# Patient Record
Sex: Male | Born: 1945 | Race: White | Hispanic: No | Marital: Single | Smoking: Former smoker
Health system: Southern US, Community
[De-identification: ages and names within clinical notes are randomized; demographics above are authoritative.]

## PROBLEM LIST (undated history)

## (undated) DIAGNOSIS — I8390 Asymptomatic varicose veins of unspecified lower extremity: Secondary | ICD-10-CM

## (undated) DIAGNOSIS — N390 Urinary tract infection, site not specified: Secondary | ICD-10-CM

## (undated) DIAGNOSIS — I714 Abdominal aortic aneurysm, without rupture: Secondary | ICD-10-CM

## (undated) DIAGNOSIS — I2510 Atherosclerotic heart disease of native coronary artery without angina pectoris: Secondary | ICD-10-CM

## (undated) DIAGNOSIS — M1990 Unspecified osteoarthritis, unspecified site: Secondary | ICD-10-CM

## (undated) DIAGNOSIS — K219 Gastro-esophageal reflux disease without esophagitis: Secondary | ICD-10-CM

## (undated) DIAGNOSIS — C61 Malignant neoplasm of prostate: Secondary | ICD-10-CM

## (undated) DIAGNOSIS — Z8719 Personal history of other diseases of the digestive system: Secondary | ICD-10-CM

## (undated) DIAGNOSIS — E119 Type 2 diabetes mellitus without complications: Secondary | ICD-10-CM

## (undated) DIAGNOSIS — I10 Essential (primary) hypertension: Secondary | ICD-10-CM

## (undated) DIAGNOSIS — I209 Angina pectoris, unspecified: Secondary | ICD-10-CM

## (undated) DIAGNOSIS — G2581 Restless legs syndrome: Secondary | ICD-10-CM

## (undated) HISTORY — PX: WRIST ARTHROPLASTY: SHX1088

## (undated) HISTORY — PX: INGUINAL HERNIA REPAIR: SUR1180

## (undated) HISTORY — DX: Type 2 diabetes mellitus without complications: E11.9

## (undated) HISTORY — PX: REPLACEMENT TOTAL KNEE BILATERAL: SUR1225

---

## 1982-09-27 HISTORY — PX: FEMUR FRACTURE SURGERY: SHX633

## 2001-08-03 ENCOUNTER — Encounter: Admission: RE | Admit: 2001-08-03 | Discharge: 2001-11-01 | Payer: Self-pay

## 2004-06-23 ENCOUNTER — Encounter: Admission: RE | Admit: 2004-06-23 | Discharge: 2004-06-23 | Payer: Self-pay | Admitting: Internal Medicine

## 2004-09-27 HISTORY — PX: OTHER SURGICAL HISTORY: SHX169

## 2010-09-27 HISTORY — PX: REPLACEMENT TOTAL KNEE BILATERAL: SUR1225

## 2012-09-28 ENCOUNTER — Emergency Department (HOSPITAL_COMMUNITY): Payer: BC Managed Care – PPO

## 2012-09-28 ENCOUNTER — Inpatient Hospital Stay (HOSPITAL_COMMUNITY)
Admission: EM | Admit: 2012-09-28 | Discharge: 2012-09-30 | DRG: 853 | Disposition: A | Discharge status: Home / Self Care | Payer: BC Managed Care – PPO | Attending: Interventional Cardiology | Admitting: Interventional Cardiology

## 2012-09-28 ENCOUNTER — Encounter (HOSPITAL_COMMUNITY): Payer: Self-pay | Admitting: Vascular Surgery

## 2012-09-28 DIAGNOSIS — Z87891 Personal history of nicotine dependence: Secondary | ICD-10-CM

## 2012-09-28 DIAGNOSIS — N390 Urinary tract infection, site not specified: Secondary | ICD-10-CM | POA: Insufficient documentation

## 2012-09-28 DIAGNOSIS — I214 Non-ST elevation (NSTEMI) myocardial infarction: Principal | ICD-10-CM | POA: Diagnosis present

## 2012-09-28 DIAGNOSIS — I5031 Acute diastolic (congestive) heart failure: Secondary | ICD-10-CM

## 2012-09-28 DIAGNOSIS — I714 Abdominal aortic aneurysm, without rupture, unspecified: Secondary | ICD-10-CM | POA: Diagnosis present

## 2012-09-28 DIAGNOSIS — I8390 Asymptomatic varicose veins of unspecified lower extremity: Secondary | ICD-10-CM | POA: Diagnosis present

## 2012-09-28 DIAGNOSIS — K219 Gastro-esophageal reflux disease without esophagitis: Secondary | ICD-10-CM | POA: Diagnosis present

## 2012-09-28 DIAGNOSIS — Z96659 Presence of unspecified artificial knee joint: Secondary | ICD-10-CM

## 2012-09-28 DIAGNOSIS — R079 Chest pain, unspecified: Secondary | ICD-10-CM

## 2012-09-28 DIAGNOSIS — B952 Enterococcus as the cause of diseases classified elsewhere: Secondary | ICD-10-CM | POA: Insufficient documentation

## 2012-09-28 DIAGNOSIS — Z6841 Body Mass Index (BMI) 40.0 and over, adult: Secondary | ICD-10-CM

## 2012-09-28 DIAGNOSIS — E669 Obesity, unspecified: Secondary | ICD-10-CM | POA: Diagnosis present

## 2012-09-28 DIAGNOSIS — I249 Acute ischemic heart disease, unspecified: Secondary | ICD-10-CM | POA: Diagnosis present

## 2012-09-28 DIAGNOSIS — I872 Venous insufficiency (chronic) (peripheral): Secondary | ICD-10-CM | POA: Insufficient documentation

## 2012-09-28 DIAGNOSIS — N402 Nodular prostate without lower urinary tract symptoms: Secondary | ICD-10-CM | POA: Insufficient documentation

## 2012-09-28 DIAGNOSIS — G4730 Sleep apnea, unspecified: Secondary | ICD-10-CM | POA: Diagnosis present

## 2012-09-28 DIAGNOSIS — I2582 Chronic total occlusion of coronary artery: Secondary | ICD-10-CM | POA: Diagnosis present

## 2012-09-28 DIAGNOSIS — I10 Essential (primary) hypertension: Secondary | ICD-10-CM | POA: Diagnosis present

## 2012-09-28 DIAGNOSIS — Z86718 Personal history of other venous thrombosis and embolism: Secondary | ICD-10-CM | POA: Insufficient documentation

## 2012-09-28 DIAGNOSIS — G2581 Restless legs syndrome: Secondary | ICD-10-CM | POA: Insufficient documentation

## 2012-09-28 DIAGNOSIS — E785 Hyperlipidemia, unspecified: Secondary | ICD-10-CM | POA: Diagnosis present

## 2012-09-28 DIAGNOSIS — I2510 Atherosclerotic heart disease of native coronary artery without angina pectoris: Secondary | ICD-10-CM | POA: Diagnosis present

## 2012-09-28 DIAGNOSIS — M1990 Unspecified osteoarthritis, unspecified site: Secondary | ICD-10-CM | POA: Insufficient documentation

## 2012-09-28 HISTORY — DX: Angina pectoris, unspecified: I20.9

## 2012-09-28 HISTORY — DX: Abdominal aortic aneurysm, without rupture: I71.4

## 2012-09-28 HISTORY — DX: Essential (primary) hypertension: I10

## 2012-09-28 HISTORY — DX: Unspecified osteoarthritis, unspecified site: M19.90

## 2012-09-28 HISTORY — DX: Gastro-esophageal reflux disease without esophagitis: K21.9

## 2012-09-28 HISTORY — DX: Personal history of other diseases of the digestive system: Z87.19

## 2012-09-28 HISTORY — DX: Restless legs syndrome: G25.81

## 2012-09-28 HISTORY — DX: Asymptomatic varicose veins of unspecified lower extremity: I83.90

## 2012-09-28 LAB — PRO B NATRIURETIC PEPTIDE: Pro B Natriuretic peptide (BNP): 1051 pg/mL — ABNORMAL HIGH (ref 0–125)

## 2012-09-28 LAB — COMPREHENSIVE METABOLIC PANEL
ALT: 31 U/L (ref 0–53)
AST: 29 U/L (ref 0–37)
Albumin: 4 g/dL (ref 3.5–5.2)
Alkaline Phosphatase: 52 U/L (ref 39–117)
BUN: 15 mg/dL (ref 6–23)
CO2: 28 mEq/L (ref 19–32)
Calcium: 10.1 mg/dL (ref 8.4–10.5)
Chloride: 97 mEq/L (ref 96–112)
Creatinine, Ser: 0.94 mg/dL (ref 0.50–1.35)
GFR calc Af Amer: >90 mL/min (ref >90)
GFR calc non Af Amer: 85 mL/min — ABNORMAL LOW (ref >90)
Glucose, Bld: 104 mg/dL — ABNORMAL HIGH (ref 70–99)
Potassium: 3.9 mEq/L (ref 3.5–5.1)
Sodium: 137 mEq/L (ref 135–145)
Total Bilirubin: 0.5 mg/dL (ref 0.3–1.2)
Total Protein: 7 g/dL (ref 6.0–8.3)

## 2012-09-28 LAB — CBC
HCT: 40.3 % (ref 39.0–52.0)
Hemoglobin: 13.5 g/dL (ref 13.0–17.0)
MCH: 30 pg (ref 26.0–34.0)
MCHC: 33.5 g/dL (ref 30.0–36.0)
MCV: 89.6 fL (ref 78.0–100.0)
Platelets: 153 10*3/uL (ref 150–400)
RBC: 4.5 MIL/uL (ref 4.22–5.81)
RDW: 13.1 % (ref 11.5–15.5)
WBC: 7.6 10*3/uL (ref 4.0–10.5)

## 2012-09-28 LAB — MAGNESIUM: Magnesium: 1.8 mg/dL (ref 1.5–2.5)

## 2012-09-28 LAB — PROTIME-INR
INR: 1.04 (ref 0.00–1.49)
Prothrombin Time: 13.5 seconds (ref 11.6–15.2)

## 2012-09-28 LAB — POCT I-STAT TROPONIN I: Troponin i, poc: 0.22 ng/mL (ref 0.00–0.08)

## 2012-09-28 LAB — TROPONIN I: Troponin I: 0.32 ng/mL (ref <0.30)

## 2012-09-28 MED ORDER — ROPINIROLE HCL 0.25 MG PO TABS
0.2500 mg | ORAL_TABLET | Freq: Every day | ORAL | Status: DC
  Administered 2012-09-28 – 2012-09-29 (×2): 0.25 mg via ORAL
  Filled 2012-09-28 (×4): qty 1

## 2012-09-28 MED ORDER — ASPIRIN 81 MG PO CHEW
324.0000 mg | CHEWABLE_TABLET | ORAL | Status: AC
  Administered 2012-09-29: 07:00:00 324 mg via ORAL
  Filled 2012-09-28: qty 4

## 2012-09-28 MED ORDER — ENOXAPARIN SODIUM 150 MG/ML ~~LOC~~ SOLN
130.0000 mg | Freq: Two times a day (BID) | SUBCUTANEOUS | Status: DC
  Filled 2012-09-28 (×2): qty 1

## 2012-09-28 MED ORDER — ASPIRIN EC 81 MG PO TBEC: 81.0000 mg | DELAYED_RELEASE_TABLET | Freq: Every day | ORAL | Status: DC

## 2012-09-28 MED ORDER — METOPROLOL SUCCINATE ER 25 MG PO TB24
25.0000 mg | ORAL_TABLET | Freq: Every day | ORAL | Status: DC
  Administered 2012-09-29 – 2012-09-30 (×2): 25 mg via ORAL
  Filled 2012-09-28 (×2): qty 1

## 2012-09-28 MED ORDER — ADULT MULTIVITAMIN W/MINERALS CH
1.0000 | ORAL_TABLET | Freq: Every day | ORAL | Status: DC
  Administered 2012-09-29 – 2012-09-30 (×2): 1 via ORAL
  Filled 2012-09-28 (×2): qty 1

## 2012-09-28 MED ORDER — FERROUS GLUCONATE 324 (38 FE) MG PO TABS
325.0000 mg | ORAL_TABLET | Freq: Every day | ORAL | Status: DC
  Administered 2012-09-29 – 2012-09-30 (×2): 324 mg via ORAL
  Filled 2012-09-28 (×3): qty 1

## 2012-09-28 MED ORDER — NITROGLYCERIN 0.4 MG SL SUBL: 0.4000 mg | SUBLINGUAL_TABLET | SUBLINGUAL | Status: DC | PRN

## 2012-09-28 MED ORDER — SODIUM CHLORIDE 0.9 % IV SOLN
INTRAVENOUS | Status: DC
  Administered 2012-09-28: 23:00:00 via INTRAVENOUS

## 2012-09-28 MED ORDER — SODIUM CHLORIDE 0.9 % IJ SOLN: 3.0000 mL | Freq: Two times a day (BID) | INTRAMUSCULAR | Status: DC

## 2012-09-28 MED ORDER — SODIUM CHLORIDE 0.9 % IJ SOLN: 3.0000 mL | INTRAMUSCULAR | Status: DC | PRN

## 2012-09-28 MED ORDER — TRIAMTERENE-HCTZ 37.5-25 MG PO TABS
1.0000 | ORAL_TABLET | Freq: Every day | ORAL | Status: DC
  Administered 2012-09-29 – 2012-09-30 (×2): 1 via ORAL
  Filled 2012-09-28 (×2): qty 1

## 2012-09-28 MED ORDER — ATORVASTATIN CALCIUM 80 MG PO TABS
80.0000 mg | ORAL_TABLET | Freq: Every day | ORAL | Status: DC
  Filled 2012-09-28: qty 1

## 2012-09-28 MED ORDER — PANTOPRAZOLE SODIUM 40 MG PO TBEC
40.0000 mg | DELAYED_RELEASE_TABLET | Freq: Every day | ORAL | Status: DC
  Administered 2012-09-29: 18:00:00 40 mg via ORAL
  Filled 2012-09-28: qty 1

## 2012-09-28 MED ORDER — ACETAMINOPHEN 325 MG PO TABS: 650.0000 mg | ORAL_TABLET | ORAL | Status: DC | PRN

## 2012-09-28 MED ORDER — SODIUM CHLORIDE 0.9 % IV SOLN: 250.0000 mL | INTRAVENOUS | Status: DC | PRN

## 2012-09-28 MED ORDER — ENOXAPARIN SODIUM 150 MG/ML ~~LOC~~ SOLN
130.0000 mg | Freq: Once | SUBCUTANEOUS | Status: AC
  Administered 2012-09-28: 130 mg via SUBCUTANEOUS
  Filled 2012-09-28 (×2): qty 1

## 2012-09-28 MED ORDER — ASPIRIN 81 MG PO CHEW
324.0000 mg | CHEWABLE_TABLET | Freq: Once | ORAL | Status: AC
  Administered 2012-09-28: 324 mg via ORAL
  Filled 2012-09-28: qty 4

## 2012-09-28 MED ORDER — ONDANSETRON HCL 4 MG/2ML IJ SOLN: 4.0000 mg | Freq: Four times a day (QID) | INTRAMUSCULAR | Status: DC | PRN

## 2012-09-28 MED ORDER — DIAZEPAM 5 MG PO TABS
5.0000 mg | ORAL_TABLET | ORAL | Status: AC
  Administered 2012-09-29: 13:00:00 5 mg via ORAL
  Filled 2012-09-28: qty 1

## 2012-09-28 NOTE — ED Notes (Addendum)
Pt arrives to the ED via [facility] from [facility]. Pt reports sharp CP that radiates to the left arm with exertion. Pt reports he has been pain free today and remains so at this time. 12 lead unremarkable per EMS. Pt denies any SOB or N/V. No nitro or 324 ASA given. Pt has hx of AAA.

## 2012-09-28 NOTE — H&P (Signed)
Fernando Blankenship is a 67 y.o. male  Admit date: 09/28/2012 Referring Physician: Kirby Funk, M.D. Primary Cardiologist:: Gwynneth Albright, M.D. Chief complaint / reason for admission: Exertional chest discomfort  HPI: 67 year old gentleman with recent onset of exertional chest tightness and dyspnea over the past 72 hours. He states that walking 30 yards particularly ankle weather causes his chest to become tight and radiates into his arms. There is associated dyspnea. Rest relieves the discomfort. He denies episodes of rest. He denies orthopnea, dyspnea at rest, prior such symptoms, melena, and history of heart disease. He has had progressive dyspnea on exertion over the past several years    PMH:    Past Medical History  Diagnosis Date  . Varicose vein of leg   . Restless leg syndrome   . AAA (abdominal aortic aneurysm)   . Hypertension   . GERD (gastroesophageal reflux disease)     PSH:    Past Surgical History  Procedure Date  . Replacement total knee bilateral   . Wrist surgery   . Joint replacement     Bilateral total knee replacements  . Hernia repair     Left inguinal herniorrhaphy    ALLERGIES:   Review of patient's allergies indicates no known allergies.  Prior to Admit Meds:   (Not in a hospital admission) Family HX:    Family History  Problem Relation Age of Onset  . Other Mother   . Diabetes Mellitus II Sister   . Alcoholism Father   . Hypertension Brother   . Diabetes Mellitus II Brother   . Dementia Sister   . Throat cancer Sister   . Lung cancer Sister    Social HX:    History   Social History  . Marital Status: Single    Spouse Name: N/A    Number of Children: N/A  . Years of Education: N/A   Occupational History  . Not on file.   Social History Main Topics  . Smoking status: Former Games developer  . Smokeless tobacco: Not on file  . Alcohol Use: No  . Drug Use: No  . Sexually Active:    Other Topics Concern  . Not on file   Social  History Narrative  . No narrative on file     ROS: He has been told he snores loudly. His nose or extremity swelling. There is been progressive dyspnea on exertion over the past several years. No episodes of PND. He denies palpitations, syncope, cough, wheezing, neurological complaints, and bleeding ulcers. He has had difficulty with prostate symptoms. He is followed by Dr. Margreta Journey  Physical Exam: Blood pressure 134/75, pulse 88, temperature 98.1 F (36.7 C), temperature source Oral, resp. rate 20, SpO2 97.00%.   The patient is moderately obese. He is in no distress. Skin color is normal.  There is no nail bed cyanosis.  HEENT exam reveals pupils are equal and reactive. No Johns is noted.  Lungs are clear auscultation and percussion.  Cardiac exam reveals somewhat distant heart sounds. No murmur, rub, click, or gallop is heard.   Abdomen is obese. Palpation reveals no obvious masses. No bruits are heard. There is no tenderness.  There is 2+ bilateral lower extremity edema. Posterior tibial pulses are 1-2+ bilaterally. Femoral pulses are 1-2+ bilaterally. Without bruits. The radials are 2+ and symmetric bilaterally. Carotids reveal no bruits and upstrokes are 2+  The patient is alert and oriented x3. No motor sensory deficits and noted.  Labs:  Lab Results  Component Value Date   WBC 7.6 09/28/2012     Lab 09/28/12 1824  NA 137  K 3.9  CL 97  CO2 28  BUN 15  CREATININE 0.94  CALCIUM 10.1  PROT 7.0  BILITOT 0.5  ALKPHOS 52  ALT 31  AST 29  GLUCOSE 104*   Troponin (Point of Care Test)  [facility] 09/28/12 1853  TROPIPOC 0.22*     Radiology:    EKG:  EKG reveals normal sinus rhythm poor R wave progression J-point depression in leads 2, 3, and aVF. Suggestion of biphasic T wave in V2 and V3.  ASSESSMENT:   1. Acute coronary syndrome with elevated troponin and angina with minimal exertion  2. Hypertension  3. Obesity  4. Possible sleep apnea  5  gastroesophageal reflux  Plan:  1. Serial markers and EKGs to rule out myocardial infarction  2. Subcutaneous Lovenox  3. Beta blocker therapy  4. Coronary angiography to define anatomy and help guide therapy. The procedure and its risks including stroke, death, myocardial infarction, bleeding, allergy, kidney injury, among others were discussed in detail except above the patient.  5. If pain at rest, IV nitroglycerin will be used.  Lesleigh Noe 09/28/2012 7:30 PM

## 2012-09-28 NOTE — ED Notes (Signed)
Critical I-stat troponin results of 0.22 reported to [facility]

## 2012-09-28 NOTE — ED Provider Notes (Signed)
History     CSN: [id number]  Arrival date & time 09/28/12  1805   First MD Initiated Contact with Patient 09/28/12 1823      Chief Complaint  Patient presents with  . Chest Pain    (Consider location/radiation/quality/duration/timing/severity/associated sxs/prior treatment) Patient is a 67 y.o. male presenting with chest pain. The history is provided by the patient. No language interpreter was used.  Chest Pain The chest pain began 3 - 5 days ago. Chest pain occurs intermittently. The pain is associated with exertion. The quality of the pain is described as pressure-like. The pain does not radiate. Chest pain is worsened by exertion. Pertinent negatives for primary symptoms include no fever, no cough, no abdominal pain, no nausea and no vomiting.  Associated symptoms include lower extremity edema.  Pertinent negatives for associated symptoms include no diaphoresis, no near-syncope, no numbness, no orthopnea and no weakness. He tried nothing for the symptoms.  Pertinent negatives for past medical history include no CAD, no cancer, no COPD, no CHF, no DVT, no MI, no PE and no strokes.    67 year old male coming from Dr. Tresa Moore office today with [facility] with EKG changes. Patient has been having intermittent chest pain lasting minutes especially on exertion with shortness of breath. Resting makes the pain go away. He's had no nausea vomiting diaphoresis.   The pain is of midsternal chest pain 7/10. Dr. Katrinka Blazing has been here to see the patient. He will be admitted to his service. No family history. No prior chest pain before Monday. 1+ pitting edema to lower extremities bilaterally.   Past Medical History  Diagnosis Date  . Varicose vein of leg   . Restless leg syndrome   . AAA (abdominal aortic aneurysm)   . Hypertension   . GERD (gastroesophageal reflux disease)     Past Surgical History  Procedure Date  . Replacement total knee bilateral   . Wrist surgery   . Joint  replacement     Bilateral total knee replacements  . Hernia repair     Left inguinal herniorrhaphy    Family History  Problem Relation Age of Onset  . Other Mother   . Diabetes Mellitus II Sister   . Alcoholism Father   . Hypertension Brother   . Diabetes Mellitus II Brother   . Dementia Sister   . Throat cancer Sister   . Lung cancer Sister     History  Substance Use Topics  . Smoking status: Former Games developer  . Smokeless tobacco: Not on file  . Alcohol Use: No      Review of Systems  Constitutional: Negative.  Negative for fever and diaphoresis.  HENT: Negative.   Eyes: Negative.   Respiratory: Negative.  Negative for cough.   Cardiovascular: Positive for chest pain and leg swelling. Negative for orthopnea and near-syncope.  Gastrointestinal: Negative.  Negative for nausea, vomiting, abdominal pain and diarrhea.  Musculoskeletal: Negative for back pain.  Neurological: Negative.  Negative for weakness and numbness.  Psychiatric/Behavioral: Negative.   All other systems reviewed and are negative.    Allergies  Review of patient's allergies indicates no known allergies.  Home Medications   Current Outpatient Rx  Name  Route  Sig  Dispense  Refill  . FERROUS GLUCONATE 325 MG PO TABS   Oral   Take 325 mg by mouth daily with breakfast.         . IBUPROFEN 200 MG PO TABS   Oral   Take 800 mg  by mouth every 8 (eight) hours as needed. For pain         . ADULT MULTIVITAMIN W/MINERALS CH   Oral   Take 1 tablet by mouth daily.         Marland Kitchen PRILOSEC OTC PO   Oral   Take 1 tablet by mouth daily.         Marland Kitchen ROPINIROLE HCL 0.25 MG PO TABS   Oral   Take 0.25 mg by mouth at bedtime.          . TRIAMTERENE-HCTZ PO   Oral   Take 1 tablet by mouth every morning.           BP 145/95  Pulse 84  Temp 98.1 F (36.7 C) (Oral)  Resp 18  SpO2 98%  Physical Exam  Nursing note and vitals reviewed. Constitutional: He is oriented to person, place, and time.  He appears well-developed and well-nourished.  HENT:  Head: Normocephalic.  Eyes: Conjunctivae normal and EOM are normal. Pupils are equal, round, and reactive to light.  Neck: Normal range of motion. Neck supple.  Cardiovascular: Normal rate and normal heart sounds.   Pulmonary/Chest: Effort normal and breath sounds normal. No respiratory distress. He has no wheezes.  Abdominal: Soft. Bowel sounds are normal. He exhibits no distension. There is no tenderness.  Musculoskeletal: Normal range of motion. He exhibits edema.  Neurological: He is alert and oriented to person, place, and time.  Skin: Skin is warm and dry.  Psychiatric: He has a normal mood and affect.    ED Course  Procedures (including critical care time)  Labs Reviewed  POCT I-STAT TROPONIN I - Abnormal; Notable for the following:    Troponin i, poc 0.22 (*)     All other components within normal limits  CBC  COMPREHENSIVE METABOLIC PANEL   No results found.   No diagnosis found.    MDM  [id number] obese male with midsternal chest pain that has been intermittent x3 days especially with exertion. Patient coming from Dr. Tresa Moore office to notify Dr. Katrinka Blazing that there were EKG changes on his EKG. Patient does have a positive point of care troponin of . 22. Patient is pain-free now. Chest x-ray is unremarkable and reviewed by myself. All other labs are unremarkable. This is a sheered visit with Dr. Preston Fleeting. Patient will be admitted by [facility] and have a cardiac cath in the a.m.  Labs Reviewed  COMPREHENSIVE METABOLIC PANEL - Abnormal; Notable for the following:    Glucose, Bld 104 (*)     GFR calc non Af Amer 85 (*)     All other components within normal limits  POCT I-STAT TROPONIN I - Abnormal; Notable for the following:    Troponin i, poc 0.22 (*)     All other components within normal limits  CBC          Remi Haggard, NP 09/28/12 2012  Remi Haggard, NP 10/03/12 1245

## 2012-09-28 NOTE — ED Provider Notes (Signed)
67 year old male comes in with a three-day history of exertional chest pain. If he walks about 100 feet, he gets a tight feeling in his chest which is moderately severe and he rates it at 7/10. There is associated dyspnea, but no nausea, vomiting, or diaphoresis. The amount of exertion required to bring on chest tightness has not changed. He had symptoms like this before. On exam, lungs are clear and heart has regular rate and rhythm. He has 2-3+ pitting edema. Troponin has come back elevated at a low level of 0.22. Dr. Katrinka Blazing of cardiology has been in to evaluate the patient to admit him.   Date: 09/28/2012  Rate: 83  Rhythm: normal sinus rhythm  QRS Axis: normal  Intervals: normal  ST/T Wave abnormalities: nonspecific T wave changes  Conduction Disutrbances:none  Narrative Interpretation: Minor nonspecific T wave flattening. No prior ECG available for comparison.  Old EKG Reviewed: none available  CRITICAL CARE Performed by: RUEAV,[email or web address]   Total critical care time: 35 minutes  Critical care time was exclusive of separately billable procedures and treating other patients.  Critical care was necessary to treat or prevent imminent or life-threatening deterioration.  Critical care was time spent personally by me on the following activities: development of treatment plan with patient and/or surrogate as well as nursing, discussions with consultants, evaluation of patient's response to treatment, examination of patient, obtaining history from patient or surrogate, ordering and performing treatments and interventions, ordering and review of laboratory studies, ordering and review of radiographic studies, pulse oximetry and re-evaluation of patient's condition.  Medical screening examination/treatment/procedure(s) were conducted as a shared visit with non-physician practitioner(s) and myself.  I personally evaluated the patient during the encounter   Dione Booze, MD 09/28/12 [phone]

## 2012-09-28 NOTE — Progress Notes (Signed)
ANTICOAGULATION CONSULT NOTE - Initial Consult  Pharmacy Consult for lovenox Indication: chest pain/ACS  No Known Allergies  Patient Measurements: Wt= 288 lbs per patient (131 kg)  Vital Signs: Temp: 98.1 F (36.7 C) (01/02 1814) Temp src: Oral (01/02 1814) BP: 108/55 mmHg (01/02 2030) Pulse Rate: 82  (01/02 2030)  Labs:  [facility] 09/28/12 1824  HGB 13.5  HCT 40.3  PLT 153  APTT --  LABPROT --  INR --  HEPARINUNFRC --  CREATININE 0.94  CKTOTAL --  CKMB --  TROPONINI --    CrCl is unknown because there is no height on file for the current visit.   Medical History: Past Medical History  Diagnosis Date  . Varicose vein of leg   . Restless leg syndrome   . AAA (abdominal aortic aneurysm)   . Hypertension   . GERD (gastroesophageal reflux disease)     Assessment: 67 yo male here with CP to start lovenox.  Goal of Therapy:  Monitor platelets by anticoagulation protocol: Yes   Plan:  -Begin lovenox 130 mg sq q12h -CBC every 3 days -Will follow clinical progress  Harland German, Pharm D 09/28/2012 8:49 PM

## 2012-09-29 ENCOUNTER — Other Ambulatory Visit: Payer: Self-pay

## 2012-09-29 ENCOUNTER — Encounter (HOSPITAL_COMMUNITY)
Admission: EM | Disposition: A | Discharge status: Home / Self Care | Payer: Self-pay | Source: Home / Self Care | Attending: Interventional Cardiology

## 2012-09-29 ENCOUNTER — Ambulatory Visit (HOSPITAL_COMMUNITY): Admit: 2012-09-29 | Payer: Self-pay | Admitting: Interventional Cardiology

## 2012-09-29 ENCOUNTER — Encounter (HOSPITAL_COMMUNITY): Payer: Self-pay | Admitting: General Practice

## 2012-09-29 DIAGNOSIS — I5031 Acute diastolic (congestive) heart failure: Secondary | ICD-10-CM | POA: Insufficient documentation

## 2012-09-29 HISTORY — PX: PERCUTANEOUS CORONARY STENT INTERVENTION (PCI-S): SHX5485

## 2012-09-29 HISTORY — PX: LEFT HEART CATHETERIZATION WITH CORONARY ANGIOGRAM: SHX5451

## 2012-09-29 LAB — TROPONIN I
Troponin I: 0.33 ng/mL (ref <0.30)
Troponin I: <0.3 ng/mL (ref <0.30)

## 2012-09-29 LAB — HEMOGLOBIN A1C
Hgb A1c MFr Bld: 6.4 % — ABNORMAL HIGH (ref <5.7)
Mean Plasma Glucose: 137 mg/dL — ABNORMAL HIGH (ref <117)

## 2012-09-29 LAB — MRSA PCR SCREENING: MRSA by PCR: NEGATIVE

## 2012-09-29 LAB — TSH: TSH: 4.533 u[IU]/mL — ABNORMAL HIGH (ref 0.350–4.500)

## 2012-09-29 LAB — POCT ACTIVATED CLOTTING TIME: Activated Clotting Time: 677 seconds

## 2012-09-29 SURGERY — LEFT HEART CATHETERIZATION WITH CORONARY ANGIOGRAM
Anesthesia: LOCAL

## 2012-09-29 MED ORDER — BIVALIRUDIN 250 MG IV SOLR
INTRAVENOUS | Status: AC
  Filled 2012-09-29: qty 250

## 2012-09-29 MED ORDER — OXYCODONE-ACETAMINOPHEN 5-325 MG PO TABS: 1.0000 | ORAL_TABLET | ORAL | Status: DC | PRN

## 2012-09-29 MED ORDER — SODIUM CHLORIDE 0.9 % IV SOLN
1.7500 mg/kg/h | INTRAVENOUS | Status: AC
  Administered 2012-09-29: 1.75 mg/kg/h via INTRAVENOUS
  Filled 2012-09-29: qty 250

## 2012-09-29 MED ORDER — FENTANYL CITRATE 0.05 MG/ML IJ SOLN
INTRAMUSCULAR | Status: AC
  Filled 2012-09-29: qty 2

## 2012-09-29 MED ORDER — ATORVASTATIN CALCIUM 20 MG PO TABS
20.0000 mg | ORAL_TABLET | Freq: Every day | ORAL | Status: DC
  Administered 2012-09-29: 20 mg via ORAL
  Filled 2012-09-29 (×2): qty 1

## 2012-09-29 MED ORDER — MIDAZOLAM HCL 2 MG/2ML IJ SOLN
INTRAMUSCULAR | Status: AC
  Filled 2012-09-29: qty 2

## 2012-09-29 MED ORDER — HEPARIN (PORCINE) IN NACL 2-0.9 UNIT/ML-% IJ SOLN
INTRAMUSCULAR | Status: AC
  Filled 2012-09-29: qty 1000

## 2012-09-29 MED ORDER — LIDOCAINE HCL (PF) 1 % IJ SOLN
INTRAMUSCULAR | Status: AC
  Filled 2012-09-29: qty 30

## 2012-09-29 MED ORDER — ACETAMINOPHEN 325 MG PO TABS: 650.0000 mg | ORAL_TABLET | ORAL | Status: DC | PRN

## 2012-09-29 MED ORDER — TICAGRELOR 90 MG PO TABS
90.0000 mg | ORAL_TABLET | Freq: Two times a day (BID) | ORAL | Status: DC
  Administered 2012-09-29 – 2012-09-30 (×2): 90 mg via ORAL
  Filled 2012-09-29 (×3): qty 1

## 2012-09-29 MED ORDER — TICAGRELOR 90 MG PO TABS
ORAL_TABLET | ORAL | Status: AC
  Administered 2012-09-29: 90 mg via ORAL
  Filled 2012-09-29: qty 2

## 2012-09-29 MED ORDER — VERAPAMIL HCL 2.5 MG/ML IV SOLN
INTRAVENOUS | Status: AC
  Filled 2012-09-29: qty 2

## 2012-09-29 MED ORDER — HEPARIN SODIUM (PORCINE) 1000 UNIT/ML IJ SOLN
INTRAMUSCULAR | Status: AC
  Filled 2012-09-29: qty 1

## 2012-09-29 MED ORDER — ASPIRIN 81 MG PO CHEW
81.0000 mg | CHEWABLE_TABLET | Freq: Every day | ORAL | Status: DC
  Administered 2012-09-30: 09:00:00 81 mg via ORAL
  Filled 2012-09-29: qty 1

## 2012-09-29 MED ORDER — ONDANSETRON HCL 4 MG/2ML IJ SOLN: 4.0000 mg | Freq: Four times a day (QID) | INTRAMUSCULAR | Status: DC | PRN

## 2012-09-29 MED ORDER — SODIUM CHLORIDE 0.9 % IV SOLN
1.0000 mL/kg/h | INTRAVENOUS | Status: AC
  Administered 2012-09-29 (×2): 1 mL/kg/h via INTRAVENOUS

## 2012-09-29 MED ORDER — NITROGLYCERIN 0.2 MG/ML ON CALL CATH LAB
INTRAVENOUS | Status: AC
  Filled 2012-09-29: qty 1

## 2012-09-29 NOTE — CV Procedure (Signed)
Diagnostic Cardiac Catheterization and Coronary intervention Report  Fernando Blankenship  67 y.o.  male [birth year]  Procedure Date: 09/29/2012 Referring Physician: Kirby Funk, M.D. Primary Cardiologist:: Gwynneth Albright, M.D.   PROCEDURE:  Left heart catheterization with selective coronary angiography, left ventriculogram, drug-eluting stent implantation proximal LAD.   INDICATIONS:  Acute coronary syndrome denoted by pain with minimal activity and trace positive troponin levels x3  The risks, benefits, and details of the procedure were explained to the patient.  The patient verbalized understanding and wanted to proceed.  Informed written consent was obtained.  PROCEDURE TECHNIQUE:  After Xylocaine anesthesia a 5 French sheath was placed in the right radial artery with a single anterior needle wall stick.   Coronary angiography was done using a 5 [place] A2 MP catheter.  Left ventriculography was done using a 5 [place] A2 MP catheter.    After review of the digital images we identified the proximal LAD is a culprit for the patient's presentation. We also noted that he had anteroapical moderate hypokinesis and thinning collateralization of the distal LAD from the right coronary injections. We decided to proceed with percutaneous coronary intervention. The patient was loaded with Brilinta and a bolus followed by an infusion of bivalirudin was started. ACT was documented to be greater than 300 seconds.  Over a guidewire we upgraded the sheath to a 6 [place]. We initially used an XB LAD 6 French 3.5 cm guide catheter but could not properly engaged without damping. We then decreased iron XB LAD 6 French catheter to a 3 cm and we were able to obtain having chest without damping.  We use a prolonged guidewire across the stenosis in the LAD. We predilated with a 3.0 x 15 mm sprinter ledge and balloon. We then positioned and deployed a Promus Premier 3.0 x 24 mm long drug-eluting stent. Post  dilated with a 3.25 x 15 mm [place] sprinter in the proximal one third of the stent was further post dilated with a 3.5 x 9 mm [place] sprinter. Peak post dilatation pressure was 14 atmospheres. TIMI grade 3 flow was noted. Moderate distal disease beyond the stent was noted to not treated.  The equipment was removed and wristband applied with good hemostasis.  CONTRAST:  Total of 310 cc.  COMPLICATIONS:  None.    HEMODYNAMICS:  Aortic pressure was 110/54 mmHg; LV pressure was 122/8 mmHg; LVEDP 22 mm mercury.  There was no gradient between the left ventricle and aorta.    ANGIOGRAPHIC DATA:   The left main coronary artery is widely patent..  The left anterior descending artery is the left anterior descending contains a segmental complex thrombus containing lesion in the proximal segment before the margin of the diagonal and septal perforator. There is diffuse disease throughout this segment. Following the acute obstruction there is a segmental 50% stenosis distal to the first septal perforator and proximal to the second diagonal. The LAD wraps around the left ventricular apex..  The left circumflex artery is is moderate in size and trifurcates on the lateral wall..  The right coronary artery is is dominant. Left ventricular branch and PDA are large.Marland Kitchen  LEFT VENTRICULOGRAM:  Left ventricular angiogram was done in the 30 RAO projection and revealed decreased left ventricular systolic function with moderate mid to distal anterior wall hypokinesis. EF is 45% .  PERCUTANEOUS CORONARY INTERVENTION:   The LAD was stented with a 24 mm long by 3.0 mm Promus Premier drug-eluting stent and post  dilated to 3.5 mm in diameter at 14 atmospheres. No complications occurred. The procedure was made more challenging because of inability to achieve coaxial guide alignment from the radial approach using the guide catheter that we chose. No complications occurred because of this. The 99% stenosis with TIMI grade 3 flow was  reduced to 0% with TIMI grade 3 flow.  IMPRESSIONS:  1. Acute coronary syndrome do to 99% proximal LAD stenosis with TIMI grade 2 flow.  2. Successful drug-eluting stent implantation into the proximal LAD reducing 99% stenosis to 0% with TIMI grade 3 flow.  3. Residual 40-50% mid LAD stenosis  4. Widely patent circumflex and right coronary  5. 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 I believe anterior wall was stunned.   RECOMMENDATION:  1. Aspirin and Brilinta  2. Discontinue Angiomax  3. Discharge in a.m. if no complications.

## 2012-09-29 NOTE — Progress Notes (Signed)
Utilization Review Completed.   Javelle Donigan, RN, BSN Nurse Case Manager  [phone]  

## 2012-09-29 NOTE — Progress Notes (Signed)
TR BAND REMOVAL  LOCATION:  right radial  DEFLATED PER PROTOCOL:  yes  TIME BAND OFF / DRESSING APPLIED:   1850   SITE UPON ARRIVAL:   Level 0  SITE AFTER BAND REMOVAL:  Level 0  REVERSE ALLEN'S TEST:    positive  CIRCULATION SENSATION AND MOVEMENT:  Within Normal Limits  yes  COMMENTS:

## 2012-09-29 NOTE — Progress Notes (Signed)
Pt's 2nd troponin level comes back at 0.32.  Pt is not having any chest pain.  VSS.  Dr. Adolm Joseph made aware of troponin and updated on pt's hosp coarse thus far and probable cath in am.  No orders received at this time.

## 2012-09-29 NOTE — H&P (Signed)
He was admitted last evening with an acute coronary syndrome that included angina with minimal exertion, low positive troponins, and mildly abnormal EKG with biphasic T waves V1 through V3. She's been pain free since admission on subcutaneous Lovenox. The nature of the procedure and its indications were discussed again this morning with the patient in detail. We also discussed the risks of the procedure to include death, stroke, myocardial infarction, emergency surgery, bleeding, allergy, limb ischemia, among others. The patient understands these risks and is willing to proceed. I divides him to make his family aware that he was in the hospital and to give them some idea of the nature of his problem.

## 2012-09-30 DIAGNOSIS — E669 Obesity, unspecified: Secondary | ICD-10-CM | POA: Diagnosis present

## 2012-09-30 DIAGNOSIS — E785 Hyperlipidemia, unspecified: Secondary | ICD-10-CM | POA: Diagnosis present

## 2012-09-30 LAB — BASIC METABOLIC PANEL
BUN: 13 mg/dL (ref 6–23)
CO2: 30 mEq/L (ref 19–32)
Calcium: 9.1 mg/dL (ref 8.4–10.5)
Chloride: 98 mEq/L (ref 96–112)
Creatinine, Ser: 1.05 mg/dL (ref 0.50–1.35)
GFR calc Af Amer: 83 mL/min — ABNORMAL LOW (ref >90)
GFR calc non Af Amer: 72 mL/min — ABNORMAL LOW (ref >90)
Glucose, Bld: 132 mg/dL — ABNORMAL HIGH (ref 70–99)
Potassium: 4.2 mEq/L (ref 3.5–5.1)
Sodium: 135 mEq/L (ref 135–145)

## 2012-09-30 LAB — CBC
HCT: 40 % (ref 39.0–52.0)
Hemoglobin: 13.7 g/dL (ref 13.0–17.0)
MCH: 30.6 pg (ref 26.0–34.0)
MCHC: 34.3 g/dL (ref 30.0–36.0)
MCV: 89.5 fL (ref 78.0–100.0)
Platelets: 139 10*3/uL — ABNORMAL LOW (ref 150–400)
RBC: 4.47 MIL/uL (ref 4.22–5.81)
RDW: 13.1 % (ref 11.5–15.5)
WBC: 5.8 10*3/uL (ref 4.0–10.5)

## 2012-09-30 MED ORDER — ASPIRIN 81 MG PO CHEW: 81.0000 mg | CHEWABLE_TABLET | Freq: Every day | ORAL | Status: AC

## 2012-09-30 MED ORDER — METOPROLOL SUCCINATE ER 25 MG PO TB24: 25.0000 mg | ORAL_TABLET | Freq: Every day | ORAL | Status: DC

## 2012-09-30 MED ORDER — NITROGLYCERIN 0.4 MG SL SUBL: 0.4000 mg | SUBLINGUAL_TABLET | SUBLINGUAL | Status: DC | PRN

## 2012-09-30 MED ORDER — TICAGRELOR 90 MG PO TABS: 90.0000 mg | ORAL_TABLET | Freq: Two times a day (BID) | ORAL | Status: DC

## 2012-09-30 MED ORDER — ATORVASTATIN CALCIUM 20 MG PO TABS: 20.0000 mg | ORAL_TABLET | Freq: Every day | ORAL | Status: DC

## 2012-09-30 NOTE — Progress Notes (Signed)
CARDIAC REHAB PHASE I   PRE:  Rate/Rhythm: Sr 96  BP:  Supine:   Sitting: 127/100  Standing:    SaO2: RA  MODE:  Ambulation: 600 ft   POST:  Rate/Rhythem: 102  BP:  Supine:   Sitting: 137/78  Standing:    SaO2: RA  Pt ambulated in hallway by Fabio Pierce EP.  Pt tolerated well with no complaints of cp or sob.  Education provided at bedside.  Education includes heart healthy nutrition, exercise guidelines with activity tolerance, risk factor modification, stress management, NTG protocol with the alert of 911 for unrelieved chest pain, stent booklet with card and medication compliance. Pt declined participation in outpatient cardiac rehab exercise, pt participating in water aerobics with walking due to bilateral leg discomfort.  Pt given handout for [facility] Cardiac rehab to consider at a later time.  [phone] [phone]  Arna Medici

## 2012-09-30 NOTE — Discharge Summary (Signed)
Physician Discharge Summary  Patient ID: Fernando Blankenship MRN: [id number] DOB/AGE: 05/17/1946 67 y.o.  Admit date: 09/28/2012 Discharge date: 09/30/2012  Primary Physician:  Dr. Kirby Funk  Primary Discharge Diagnosis: 1. Unstable angina pectoris with acute coronary artery syndrome and non STEMI  Secondary Discharge Diagnosis: 2. Coronary artery disease with severe proximal LAD disease treated with drug-eluting stent with residual 40-50% mid LAD stenosis 3. Obesity 4. Hypertension 5. Abdominal aneurysm 6. History of varicose veins 7. Hyperlipidemia 8. Esophageal reflux  Procedures: Cardiac catheterization with insertion of drug-eluting stent  Hospital Course: This 67 year old male was admitted to the hospital recent onset of exertional chest tightness and dyspnea 3 days prior to admission. He noted that his symptoms were precipitated by cold weather and caused progressive chest tightness radiated into his arm. He has had also progressive dyspnea.  He was brought into the hospital and placed on subcutaneous Lovenox. Troponin is were mildly elevated. He had no EKG changes. He was taken to the cardiac catheterization laboratory to have the procedure done through the radial approach on the right. He was found to have a subtotal 99% stenosis with some stunning of the anterior wall. This was treated with a 3.0 x 24 mm Promus premier stent and was post dilated to 3.25 in the proximal one third of the stent and further post dilated with a 3.5 proximally. He was noted to have moderate disease beyond the stent that was not treated. He was ambulatory in the hall the  next day was feeling better and was discharged in improved condition. He was seen by cardiac rehabilitation but declined participation in outpatient cardiac rehabilitation as he stated that he was dissipating in water aerobics.  Discharge Exam: Blood pressure 115/75, pulse 87, temperature 98.1 F (36.7 C), temperature source Oral, resp.  rate 13, height 5\' 7"  (1.702 m), weight 130.6 kg (287 lb 14.7 oz), SpO2 95.00%.   Radial site was clean and dry without hematoma .  Labs: CBC:   Lab Results  Component Value Date   WBC 5.8 09/30/2012   HGB 13.7 09/30/2012   HCT 40.0 09/30/2012   MCV 89.5 09/30/2012   PLT 139* 09/30/2012   CMP:  Lab 09/30/12 0530 09/28/12 1824  NA 135 --  K 4.2 --  CL 98 --  CO2 30 --  BUN 13 --  CREATININE 1.05 --  CALCIUM 9.1 --  PROT -- 7.0  BILITOT -- 0.5  ALKPHOS -- 52  ALT -- 31  AST -- 29  GLUCOSE 132* --   Lipid Panel  No results found for this basename: chol, trig, hdl, cholhdl, vldl, ldlcalc   Cardiac Enzymes:  Basename 09/29/12 1011 09/29/12 0430 09/28/12 2216  CKTOTAL -- -- --  CKMB -- -- --  CKMBINDEX -- -- --  TROPONINI <0.30 0.33* 0.32*    Radiology: Lungs and heart normal, degenerative changes of spine  EKG: Low voltage QRS, no acute changes  Discharge Medications:  Fernando Blankenship, Fernando Blankenship  Home Medication Instructions [id number]   Printed on:09/30/12 0943  Medication Information                    rOPINIRole (REQUIP) 0.25 MG tablet Take 0.25 mg by mouth at bedtime.            ibuprofen (ADVIL,MOTRIN) 200 MG tablet Take 800 mg by mouth every 8 (eight) hours as needed. For pain           TRIAMTERENE-HCTZ PO Take 1 tablet by mouth every  morning.           Omeprazole Magnesium (PRILOSEC OTC PO) Take 1 tablet by mouth daily.           Multiple Vitamin (MULTIVITAMIN WITH MINERALS) TABS Take 1 tablet by mouth daily.           ferrous gluconate (FERGON) 325 MG tablet Take 325 mg by mouth daily with breakfast.           aspirin 81 MG chewable tablet Chew 1 tablet (81 mg total) by mouth daily.           atorvastatin (LIPITOR) 20 MG tablet Take 1 tablet (20 mg total) by mouth daily at 6 PM.           nitroGLYCERIN (NITROSTAT) 0.4 MG SL tablet Place 1 tablet (0.4 mg total) under the tongue every 5 (five) minutes x 3 doses as needed for chest pain.             Ticagrelor (BRILINTA) 90 MG TABS tablet Take 1 tablet (90 mg total) by mouth 2 (two) times daily.           metoprolol succinate (TOPROL-XL) 25 MG 24 hr tablet Take 1 tablet (25 mg total) by mouth daily.             Followup plans and appointments: Dr. Katrinka Blazing in 10 days    Time spent with patient to include physician time: 30 minutes Signed: W. Ashley Royalty. MD [facility] 09/30/2012, 9:43 AM

## 2012-10-02 MED FILL — Dextrose Inj 5%: INTRAVENOUS | Qty: 50 | Status: AC

## 2012-10-04 NOTE — Progress Notes (Signed)
Utilization Review Completed.   Jezreel Justiniano, RN, BSN Nurse Case Manager  [phone]  

## 2013-07-31 ENCOUNTER — Other Ambulatory Visit: Payer: Self-pay

## 2013-07-31 MED ORDER — NITROGLYCERIN 0.4 MG SL SUBL: 0.4000 mg | SUBLINGUAL_TABLET | SUBLINGUAL | Status: DC | PRN

## 2013-08-02 ENCOUNTER — Telehealth: Payer: Self-pay | Admitting: Interventional Cardiology

## 2013-08-02 ENCOUNTER — Other Ambulatory Visit: Payer: Self-pay

## 2013-08-02 MED ORDER — NITROGLYCERIN 0.4 MG SL SUBL: 0.4000 mg | SUBLINGUAL_TABLET | SUBLINGUAL | Status: DC | PRN

## 2013-08-02 NOTE — Telephone Encounter (Signed)
New problem      [facility]   On [address]. Gave call to refills

## 2013-08-06 ENCOUNTER — Encounter: Payer: Self-pay | Admitting: Interventional Cardiology

## 2013-08-09 ENCOUNTER — Other Ambulatory Visit: Payer: Self-pay | Admitting: *Deleted

## 2013-08-09 MED ORDER — NITROGLYCERIN 0.4 MG SL SUBL: 0.4000 mg | SUBLINGUAL_TABLET | SUBLINGUAL | Status: DC | PRN

## 2013-10-18 ENCOUNTER — Ambulatory Visit: Payer: BC Managed Care – PPO | Admitting: Interventional Cardiology

## 2013-10-22 ENCOUNTER — Encounter (HOSPITAL_COMMUNITY): Payer: Self-pay | Admitting: Cardiology

## 2013-10-24 ENCOUNTER — Ambulatory Visit: Payer: BC Managed Care – PPO | Admitting: Interventional Cardiology

## 2013-10-28 ENCOUNTER — Other Ambulatory Visit: Payer: Self-pay | Admitting: *Deleted

## 2013-10-28 DIAGNOSIS — E785 Hyperlipidemia, unspecified: Secondary | ICD-10-CM

## 2013-11-27 ENCOUNTER — Encounter: Payer: Self-pay | Admitting: Interventional Cardiology

## 2013-12-06 ENCOUNTER — Ambulatory Visit: Payer: BC Managed Care – PPO | Admitting: Interventional Cardiology

## 2014-06-14 ENCOUNTER — Encounter: Payer: Self-pay | Admitting: *Deleted

## 2014-08-07 ENCOUNTER — Ambulatory Visit
Admission: RE | Admit: 2014-08-07 | Discharge: 2014-08-07 | Disposition: A | Discharge status: Home / Self Care | Payer: BC Managed Care – PPO | Source: Ambulatory Visit | Attending: Internal Medicine | Admitting: Internal Medicine

## 2014-08-07 ENCOUNTER — Other Ambulatory Visit: Payer: Self-pay | Admitting: Internal Medicine

## 2014-08-07 DIAGNOSIS — J069 Acute upper respiratory infection, unspecified: Secondary | ICD-10-CM

## 2014-09-05 ENCOUNTER — Encounter (HOSPITAL_COMMUNITY): Payer: Self-pay | Admitting: Interventional Cardiology

## 2015-10-09 ENCOUNTER — Encounter: Payer: BLUE CROSS/BLUE SHIELD | Attending: Internal Medicine

## 2015-10-09 VITALS — Ht 67.0 in | Wt 280.6 lb

## 2015-10-09 DIAGNOSIS — E119 Type 2 diabetes mellitus without complications: Secondary | ICD-10-CM | POA: Diagnosis present

## 2015-10-09 DIAGNOSIS — Z713 Dietary counseling and surveillance: Secondary | ICD-10-CM | POA: Diagnosis not present

## 2015-10-09 NOTE — Progress Notes (Signed)

## 2015-10-16 DIAGNOSIS — E119 Type 2 diabetes mellitus without complications: Secondary | ICD-10-CM

## 2015-10-23 DIAGNOSIS — E119 Type 2 diabetes mellitus without complications: Secondary | ICD-10-CM

## 2015-10-23 NOTE — Progress Notes (Signed)

## 2015-10-23 NOTE — Progress Notes (Signed)
Patient was seen on 10/23/15 for the third of a series of three diabetes self-management courses at the [facility].   Catalina Gravel the amount of activity recommended for healthy living . Describe activities suitable for individual needs . Identify ways to regularly incorporate activity into daily life . Identify barriers to activity and ways to over come these barriers  Identify diabetes medications being personally used and their primary action for lowering glucose and possible side effects . Describe role of stress on blood glucose and develop strategies to address psychosocial issues . Identify diabetes complications and ways to prevent them  Explain how to manage diabetes during illness . Evaluate success in meeting personal goal . Establish 2-3 goals that they will plan to diligently work on until they return for the  68-month follow-up visit  Goals:   I will be active 30 minutes or more 3 times a week  I will test my glucose at least 2 times a day, 7 days a week  To help manage stress I will  exercise at least 3 times a week  Your patient has identified these potential barriers to change:  None identified  Your patient has identified their diabetes self-care support plan as  None identified  Plan:  Attend Optional Core 4 in 4 months

## 2017-06-23 DIAGNOSIS — H903 Sensorineural hearing loss, bilateral: Secondary | ICD-10-CM | POA: Insufficient documentation

## 2017-06-23 DIAGNOSIS — H9313 Tinnitus, bilateral: Secondary | ICD-10-CM | POA: Insufficient documentation

## 2017-07-01 ENCOUNTER — Encounter: Payer: Self-pay | Admitting: Cardiology

## 2018-07-06 NOTE — Progress Notes (Signed)
Shiroma, Fernando Blankenship  Date of visit:  07/01/2017 DOB:  [birth year]    Age:  72 yrs. Medical record number:  [id number]     Account number:  [id number] Primary Care Provider: Delanna Ahmadi ____________________________ CURRENT DIAGNOSES  1. CAD Native without angina  2. Essential (primary) hypertension  3. Gastro-esophageal reflux disease without esophagitis  4. Hyperlipidemia  5. Presence of coronary stent  6. Morbid (severe) obesity ____________________________ ALLERGIES  Clopidogrel, Rash ____________________________ MEDICATIONS  1. aspirin 81 mg chewable tablet, 1 p.o. daily  2. atorvastatin 20 mg tablet, 1 p.o. daily  3. B Complex 1 tablet, 1 p.o. daily  4. ibuprofen 200 mg tablet, PRN  5. iron 325 mg (65 mg iron) tablet, 1 p.o. daily  6. metformin 500 mg tablet, 1 p.o. daily  7. metoprolol succinate ER 25 mg tablet,extended release 24 hr, 1 p.o. daily  8. multivitamin tablet, 1 p.o. daily  9. nitroglycerin 0.4 mg sublingual tablet, PRN  10. oxybutynin chloride 5 mg tablet, Take as directed  11. Prilosec OTC 20 mg tablet,delayed release, 1 p.o. daily  12. ropinirole 1 mg tablet, PRN  13. Tart Cherry Extract 1,000 mg capsule, 8 qd  14. triamterene 37.5 mg-hydrochlorothiazide 25 mg capsule, 1 p.o. daily  15. Tylenol Extra Strength 500 mg tablet, PRN ____________________________ CHIEF COMPLAINTS  Followup of CAD Native without angina ____________________________ HISTORY OF PRESENT ILLNESS Patient returns for cardiac followup. He has had a good year since he was here and denies angina. He has no PND, orthopnea, syncope, palpitations, or claudication. He continues to work in [place] at [employer]. He has gained about 10 pounds of weight since he was here. 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 Previous lipids reviewed from primary doctor with good control of  LDL. ____________________________ PAST HISTORY  Past Medical Illnesses:  hypertension, hyperlipidemia, obesity, sleep apnea, GERD, AAA-asymptomatic, venous insufficiency, restless legs, DM-non-insulin dependent;  Cardiovascular Illnesses:  CAD, abdominal aneurysm;  Surgical Procedures:  knee replacement-bil, fx femur, hernia repair, wrist arthroplasty;  NYHA Classification:  I;  Canadian Angina Classification:  Class 0: Asymptomatic;  Cardiology Procedures-Invasive:  cardiac cath (left) January 2014, Promus stent 3.0 x 24 stent in prox LAD dilated to 3.5 and 3.25 mm Dr. Tamala Julian;  Cardiology Procedures-Noninvasive:  treadmill;  Cardiac Cath Results:  normal Left main, no significant disease CFX, no significant disease RCA, 99% stenosis proximal LAD, 50% stenosis mid LAD;  LVEF of 45% documented via cardiac cath on 09/28/2012,   ____________________________ CARDIO-PULMONARY TEST DATES EKG Date:  05/06/2016;   Cardiac Cath Date:  09/28/2012;  Stent Placement Date: 09/29/2012;  Chest Xray Date: 10/25/2013;   ____________________________ FAMILY HISTORY Brother -- Brother alive with problem, Diabetes mellitus Father -- Father dead, Alcoholism Mother -- Mother dead, Diabetes mellitus Sister -- Sister dead, Cancer Sister -- Sister alive and well Sister -- Sister dead, Dementia/Alzheimers, Diabetes mellitus ____________________________ SOCIAL HISTORY Alcohol Use:  no alcohol use;  Smoking:  used to smoke but quit 1990, 25 pack year history;  Diet:  regular diet;  Lifestyle:  divorced;  Exercise:  water exercise and exercise bike;  Occupation:  Publishing rights manager;  Residence:  lives alone;   ____________________________ REVIEW OF SYSTEMS General:  weight gain of approximately 10 lbs Eyes: wears eye glasses/contact lenses Respiratory: denies dyspnea, cough, wheezing or hemoptysis. Cardiovascular:  please review HPI Abdominal: denies dyspepsia, GI bleeding, constipation, or diarrhea  Musculoskeletal:   arthritis of the ankles, arthritis of the hands Endocrine: new diagnosis of diabetes  ____________________________ PHYSICAL EXAMINATION VITAL SIGNS  Blood Pressure:  118/78 Sitting, Left arm, regular cuff   Pulse:  80/min. Weight:  278.00 lbs. Height:  67.00"BMI: 43  Constitutional:  pleasant white male in no acute distress walks with cane, severely obese walks with cane Skin:  warm and dry to touch, no apparent skin lesions, or masses noted. Head:  normocephalic, balding male hair pattern Neck:  supple, without massess. No JVD, thyromegaly or carotid bruits. Carotid upstroke normal. Chest:  normal symmetry, clear to auscultation. Cardiac:  regular rhythm, normal S1 and S2, No S3 or S4, no murmurs, gallops or rubs detected. Peripheral Pulses:  pulses full and equal in all extremities Extremities & Back:  bilateral venous insufficiency changes present, trace edema Neurological:  no gross motor or sensory deficits noted, affect appropriate, oriented x3. ____________________________ MOST RECENT LIPID PANEL 11/19/16  CHOL TOTL 125 mg/dl, LDL 40 NM, HDL 33 mg/dl and TRIGLYCER 264 mg/dl ____________________________ IMPRESSIONS/PLAN  1. CAD with previous LAD stent with no angina 2. Hypertension controlled 3. Morbid obesity with weight gain-need to lose additional weight  4. Hyperlipidemia under treatment  Recommendations:  Refill of medications done. Stressed importance of additional weight reduction and regular exercise. Followup in one year and call if recurrent problems. ____________________________ TODAYS ORDERS  1. 12 Lead EKG: 1 year  2. Return Visit: 1 year                       ____________________________ Cardiology Physician:  Kerry Hough MD [facility]

## 2018-08-16 ENCOUNTER — Telehealth: Payer: Self-pay | Admitting: Cardiology

## 2018-08-16 ENCOUNTER — Other Ambulatory Visit: Payer: Self-pay | Admitting: Cardiology

## 2018-08-16 MED ORDER — METOPROLOL SUCCINATE ER 25 MG PO TB24: 25.0000 mg | ORAL_TABLET | Freq: Every day | ORAL | 0 refills | Status: DC

## 2018-08-16 NOTE — Telephone Encounter (Signed)
30 day supply of metoprolol succinate [facility] [place] with no refills as patient is overdue for follow up. Paula at the front desk has left message for patient to contact our office to schedule appointment.

## 2018-08-16 NOTE — Telephone Encounter (Signed)
LAM for patient to call back to schedule f/up. Due 07/06/18, informed pt prescription was called in.

## 2018-08-16 NOTE — Telephone Encounter (Signed)
° ° °  1. Which medications need to be refilled? (please list name of each medication and dose if known) Metoprolol succ ER 25mg  tablet 1QD  2. Which pharmacy/location (including street and city if local pharmacy) is medication to be sent to? [facility] [address] [facility]  3. Do they need a 30 day or 90 day supply? [place]

## 2018-08-23 MED ORDER — METOPROLOL SUCCINATE ER 25 MG PO TB24: 25.0000 mg | ORAL_TABLET | Freq: Every day | ORAL | 0 refills | Status: DC

## 2018-08-23 NOTE — Telephone Encounter (Signed)
Rx resent to [facility] on [address] as requested.

## 2018-09-01 ENCOUNTER — Ambulatory Visit: Payer: BLUE CROSS/BLUE SHIELD | Admitting: Cardiology

## 2018-09-04 ENCOUNTER — Other Ambulatory Visit: Payer: Self-pay | Admitting: Cardiology

## 2018-09-04 NOTE — Telephone Encounter (Signed)
° ° ° °  1. Which medications need to be refilled? (please list name of each medication and dose if known) atorvastatin 20mg  tablet 1QD  2. Which pharmacy/location (including street and city if local pharmacy) is medication to be sent to?[address] [facility]  3. Do they need a 30 day or 90 day supply? [place]

## 2018-09-05 MED ORDER — ATORVASTATIN CALCIUM 20 MG PO TABS: 20.0000 mg | ORAL_TABLET | Freq: Every day | ORAL | 0 refills | Status: DC

## 2018-09-05 NOTE — Telephone Encounter (Signed)
[facility] [place] with no refills. Patient is scheduled to see Dr. Geraldo Pitter on 10/06/2018. Further refills will be provided during appointment.

## 2018-09-13 ENCOUNTER — Other Ambulatory Visit: Payer: Self-pay | Admitting: *Deleted

## 2018-09-13 NOTE — Telephone Encounter (Signed)
*  STAT* If patient is at the pharmacy, call can be transferred to refill team.   1. Which medications need to be refilled? (please list name of each medication and dose if known) Metoprolol Succ ER 25 mg qd  2. Which pharmacy/location (including street and city if local pharmacy) is [facility] [address]  3. Do they need a 30 day or 90 day supply? [place]

## 2018-09-14 MED ORDER — METOPROLOL SUCCINATE ER 25 MG PO TB24: 25.0000 mg | ORAL_TABLET | Freq: Every day | ORAL | 0 refills | Status: DC

## 2018-09-14 NOTE — Telephone Encounter (Signed)
Has been given to Dr. Agustin Cree for him to review

## 2018-09-14 NOTE — Telephone Encounter (Signed)
Metoprolol succinate refilled 25 mg daily per Dr. Agustin Cree.

## 2018-10-03 ENCOUNTER — Telehealth: Payer: Self-pay

## 2018-10-03 MED ORDER — ATORVASTATIN CALCIUM 20 MG PO TABS: 20.0000 mg | ORAL_TABLET | Freq: Every day | ORAL | 0 refills | Status: DC

## 2018-10-03 NOTE — Telephone Encounter (Signed)
Patient has an appointment on 10/06/2018 with [facility]. Rx for atorvastatin 20mg  #30 0 refills sent to pharmacy.

## 2018-10-04 ENCOUNTER — Other Ambulatory Visit: Payer: Self-pay

## 2018-10-06 ENCOUNTER — Ambulatory Visit (INDEPENDENT_AMBULATORY_CARE_PROVIDER_SITE_OTHER): Payer: BLUE CROSS/BLUE SHIELD | Admitting: Cardiology

## 2018-10-06 ENCOUNTER — Encounter: Payer: Self-pay | Admitting: Cardiology

## 2018-10-06 VITALS — BP 122/68 | HR 80 | Ht 67.0 in | Wt 270.0 lb

## 2018-10-06 DIAGNOSIS — I2510 Atherosclerotic heart disease of native coronary artery without angina pectoris: Secondary | ICD-10-CM

## 2018-10-06 DIAGNOSIS — E669 Obesity, unspecified: Secondary | ICD-10-CM

## 2018-10-06 DIAGNOSIS — E088 Diabetes mellitus due to underlying condition with unspecified complications: Secondary | ICD-10-CM | POA: Diagnosis not present

## 2018-10-06 DIAGNOSIS — E782 Mixed hyperlipidemia: Secondary | ICD-10-CM | POA: Diagnosis not present

## 2018-10-06 NOTE — Patient Instructions (Signed)
Medication Instructions:  Your physician recommends that you continue on your current medications as directed. Please refer to the Current Medication list given to you today.  If you need a refill on your cardiac medications before your next appointment, please call your pharmacy.   Lab work: None.  If you have labs (blood work) drawn today and your tests are completely normal, you will receive your results only by: . MyChart Message (if you have MyChart) OR . A paper copy in the mail If you have any lab test that is abnormal or we need to change your treatment, we will call you to review the results.  Testing/Procedures: None.   Follow-Up: At [facility], you and your health needs are our priority.  As part of our continuing mission to provide you with exceptional heart care, we have created designated Provider Care Teams.  These Care Teams include your primary Cardiologist (physician) and Advanced Practice Providers (APPs -  Physician Assistants and Nurse Practitioners) who all work together to provide you with the care you need, when you need it. You will need a follow up appointment in 6 months.  Please call our office 2 months in advance to schedule this appointment.  You may see No primary care provider on file. or another member of our [facility] Provider Team in [place]: Robert Krasowski, MD . Brian Munley, MD  Any Other Special Instructions Will Be Listed Below (If Applicable).     
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
## 2018-10-13 ENCOUNTER — Telehealth: Payer: Self-pay | Admitting: Cardiology

## 2018-10-13 ENCOUNTER — Other Ambulatory Visit: Payer: Self-pay | Admitting: Cardiology

## 2018-10-13 ENCOUNTER — Telehealth: Payer: Self-pay | Admitting: Emergency Medicine

## 2018-10-13 MED ORDER — ATORVASTATIN CALCIUM 20 MG PO TABS: 20.0000 mg | ORAL_TABLET | Freq: Every day | ORAL | 2 refills | Status: AC

## 2018-10-13 NOTE — Telephone Encounter (Signed)
error 

## 2018-10-13 NOTE — Telephone Encounter (Signed)
Rx for atorvastatin sent to [id number] with 2 refills.

## 2018-10-13 NOTE — Telephone Encounter (Signed)
° °  1. Which medications need to be refilled? (please list name of each medication and dose if known) atorvastatin  2. Which pharmacy/location (including street and city if local pharmacy) is medication to be sent to?[facility]  3. Do they need a 30 day or 90 day supply? [place]

## 2018-11-06 ENCOUNTER — Other Ambulatory Visit: Payer: Self-pay

## 2018-11-06 MED ORDER — METOPROLOL SUCCINATE ER 25 MG PO TB24: 25.0000 mg | ORAL_TABLET | Freq: Every day | ORAL | 1 refills | Status: AC

## 2019-02-19 ENCOUNTER — Other Ambulatory Visit: Payer: Self-pay | Admitting: Cardiology

## 2020-12-22 ENCOUNTER — Encounter: Payer: Self-pay | Admitting: Radiation Oncology

## 2020-12-22 DIAGNOSIS — K2990 Gastroduodenitis, unspecified, without bleeding: Secondary | ICD-10-CM | POA: Insufficient documentation

## 2020-12-22 DIAGNOSIS — J309 Allergic rhinitis, unspecified: Secondary | ICD-10-CM | POA: Insufficient documentation

## 2020-12-22 DIAGNOSIS — M152 Bouchard's nodes (with arthropathy): Secondary | ICD-10-CM | POA: Insufficient documentation

## 2020-12-22 DIAGNOSIS — Z86010 Personal history of colonic polyps: Secondary | ICD-10-CM | POA: Insufficient documentation

## 2020-12-22 NOTE — Progress Notes (Signed)
GU Location of Tumor / Histology: prostatic adenocarcinoma  If Prostate Cancer, Gleason Score is (3 + 4) and PSA is (5.48). Prostate volume: 26.55 grams    Biopsies of prostate (if applicable) revealed:   Past/Anticipated interventions by urology, if any: prostate biopsy, referral for consideration of brachytherapy  Past/Anticipated interventions by medical oncology, if any: no  Weight changes, if any: denies  Bowel/Bladder complaints, if any: IPSS 14. SHIM 1. Not sexually active currently. Reports urinary frequency, urgency, nocturia, and leakage. Reports wearing a depends. Denies any bowel complaints. Denies dysuria or hematuria.  Nausea/Vomiting, if any: denies  Pain issues, if any:  Reports intermittent low back pain and bilateral ankle pain. Denies knowing cause of this pain. Denies taking medication prescription/OTC to manage pain.  SAFETY ISSUES:  Prior radiation? denies  Pacemaker/ICD? denies  Possible current pregnancy? no, male patient  Is the patient on methotrexate? denies  Current Complaints / other details:  75 year old male. Single.

## 2020-12-23 ENCOUNTER — Ambulatory Visit
Admission: RE | Admit: 2020-12-23 | Discharge: 2020-12-23 | Disposition: A | Discharge status: Home / Self Care | Payer: BLUE CROSS/BLUE SHIELD | Source: Ambulatory Visit | Attending: Radiation Oncology | Admitting: Radiation Oncology

## 2020-12-23 ENCOUNTER — Other Ambulatory Visit: Payer: Self-pay

## 2020-12-23 ENCOUNTER — Encounter: Payer: Self-pay | Admitting: Radiation Oncology

## 2020-12-23 VITALS — Ht 67.0 in | Wt 265.0 lb

## 2020-12-23 DIAGNOSIS — C61 Malignant neoplasm of prostate: Secondary | ICD-10-CM

## 2020-12-23 HISTORY — DX: Malignant neoplasm of prostate: C61
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
## 2020-12-25 ENCOUNTER — Telehealth: Payer: Self-pay | Admitting: *Deleted

## 2020-12-25 NOTE — Telephone Encounter (Signed)
CALLED PATIENT TO ASK QUESTIONS, LVM FOR A RETURN CALL 

## 2021-01-08 ENCOUNTER — Telehealth: Payer: Self-pay | Admitting: *Deleted

## 2021-01-08 NOTE — Telephone Encounter (Signed)
CALLED PATIENT TO INFORM OF PRE-SEED APPTS. FOR 02-12-21, SPOKE WITH PATIENT AND HE IS AWARE OF THESE APPTS.

## 2021-01-15 ENCOUNTER — Other Ambulatory Visit: Payer: Self-pay | Admitting: Urology

## 2021-01-15 ENCOUNTER — Telehealth: Payer: Self-pay | Admitting: *Deleted

## 2021-01-15 NOTE — Telephone Encounter (Signed)
CALLED PATIENT TO INFORM OF PRE-SEED APPTS. AND IMPLANT DATE, LVM FOR A RETURN CALL 

## 2021-02-02 ENCOUNTER — Encounter: Payer: Self-pay | Admitting: Medical Oncology

## 2021-02-11 ENCOUNTER — Telehealth: Payer: Self-pay | Admitting: *Deleted

## 2021-02-11 NOTE — Telephone Encounter (Signed)
CALLED PATIENT TO REMIND OF PRE-SEED APPTS. FOR 02-12-21, LVM FOR A RETURN CALL 

## 2021-02-12 ENCOUNTER — Encounter: Payer: Self-pay | Admitting: Medical Oncology

## 2021-02-12 ENCOUNTER — Ambulatory Visit (HOSPITAL_COMMUNITY)
Admission: RE | Admit: 2021-02-12 | Discharge: 2021-02-12 | Disposition: A | Discharge status: Home / Self Care | Payer: BC Managed Care – PPO | Source: Ambulatory Visit | Attending: Urology | Admitting: Urology

## 2021-02-12 ENCOUNTER — Ambulatory Visit
Admission: RE | Admit: 2021-02-12 | Discharge: 2021-02-12 | Disposition: A | Discharge status: Home / Self Care | Payer: BLUE CROSS/BLUE SHIELD | Source: Ambulatory Visit | Attending: Urology | Admitting: Urology

## 2021-02-12 ENCOUNTER — Ambulatory Visit
Admission: RE | Admit: 2021-02-12 | Discharge: 2021-02-12 | Disposition: A | Discharge status: Home / Self Care | Payer: BC Managed Care – PPO | Source: Ambulatory Visit | Attending: Radiation Oncology | Admitting: Radiation Oncology

## 2021-02-12 ENCOUNTER — Other Ambulatory Visit: Payer: Self-pay

## 2021-02-12 ENCOUNTER — Encounter (HOSPITAL_COMMUNITY)
Admission: RE | Admit: 2021-02-12 | Discharge: 2021-02-12 | Disposition: A | Discharge status: Home / Self Care | Payer: BC Managed Care – PPO | Source: Ambulatory Visit | Attending: Urology | Admitting: Urology

## 2021-02-12 DIAGNOSIS — C61 Malignant neoplasm of prostate: Secondary | ICD-10-CM | POA: Diagnosis present

## 2021-02-12 DIAGNOSIS — Z01818 Encounter for other preprocedural examination: Secondary | ICD-10-CM

## 2021-02-12 NOTE — Progress Notes (Signed)
  Radiation Oncology         [phone] ________________________________  Name: Fernando Blankenship MRN: [id number]  Date: 02/12/2021  DOB: 1946-07-12  SIMULATION AND TREATMENT PLANNING NOTE PUBIC ARCH STUDY  [id number], [name], MD  Franchot Gallo, MD  DIAGNOSIS: 75 y.o. gentleman with Stage T1c adenocarcinoma of the prostate with Gleason score of 3+4, and PSA of 5.48.  Oncology History  Malignant neoplasm of prostate ([place])  10/31/2020 Cancer Staging   Staging form: Prostate, AJCC 8th Edition - Clinical stage from 10/31/2020: Stage IIB (cT1c, cN0, cM0, PSA: 5.5, Grade Group: 2) - Signed by Freeman Caldron, PA-C on 12/23/2020 Histopathologic type: Adenocarcinoma, NOS Stage prefix: Initial diagnosis Prostate specific antigen (PSA) range: Less than 10 Gleason primary pattern: 3 Gleason secondary pattern: 4 Gleason score: 7 Histologic grading system: 5 grade system Number of biopsy cores examined: 12 Number of biopsy cores positive: 8 Location of positive needle core biopsies: Both sides   12/23/2020 Initial Diagnosis   Malignant neoplasm of prostate ([place])       ICD-10-CM   1. Malignant neoplasm of prostate ([place])  C61     COMPLEX SIMULATION:  The patient presented today for evaluation for possible prostate seed implant. He was brought to the radiation planning suite and placed supine on the CT couch. A 3-dimensional image study set was obtained in upload to the planning computer. There, on each axial slice, I contoured the prostate gland. Then, using three-dimensional radiation planning tools I reconstructed the prostate in view of the structures from the transperineal needle pathway to assess for possible pubic arch interference. In doing so, I did not appreciate any pubic arch interference. Also, the patient's prostate volume was estimated based on the drawn structure. The volume was 27 cc.  Given the pubic arch appearance and prostate volume, patient remains a good candidate to proceed  with prostate seed implant. Today, he freely provided informed written consent to proceed.    PLAN: The patient will undergo prostate seed implant.   ________________________________  Sheral Apley. Tammi Klippel, M.D.

## 2021-04-16 ENCOUNTER — Other Ambulatory Visit: Payer: Self-pay

## 2021-04-16 ENCOUNTER — Encounter (HOSPITAL_COMMUNITY)
Admission: RE | Admit: 2021-04-16 | Discharge: 2021-04-16 | Disposition: A | Discharge status: Home / Self Care | Payer: BC Managed Care – PPO | Source: Ambulatory Visit | Attending: Urology | Admitting: Urology

## 2021-04-16 DIAGNOSIS — Z01818 Encounter for other preprocedural examination: Secondary | ICD-10-CM | POA: Insufficient documentation

## 2021-04-16 LAB — COMPREHENSIVE METABOLIC PANEL
ALT: 42 U/L (ref 0–44)
AST: 35 U/L (ref 15–41)
Albumin: 4.3 g/dL (ref 3.5–5.0)
Alkaline Phosphatase: 45 U/L (ref 38–126)
Anion gap: 11 (ref 5–15)
BUN: 15 mg/dL (ref 8–23)
CO2: 24 mmol/L (ref 22–32)
Calcium: 9.3 mg/dL (ref 8.9–10.3)
Chloride: 105 mmol/L (ref 98–111)
Creatinine, Ser: 1.06 mg/dL (ref 0.61–1.24)
GFR, Estimated: >60 mL/min (ref >60)
Glucose, Bld: 105 mg/dL — ABNORMAL HIGH (ref 70–99)
Potassium: 3.9 mmol/L (ref 3.5–5.1)
Sodium: 140 mmol/L (ref 135–145)
Total Bilirubin: 0.9 mg/dL (ref 0.3–1.2)
Total Protein: 6.8 g/dL (ref 6.5–8.1)

## 2021-04-16 LAB — CBC
HCT: 43.8 % (ref 39.0–52.0)
Hemoglobin: 15 g/dL (ref 13.0–17.0)
MCH: 31.4 pg (ref 26.0–34.0)
MCHC: 34.2 g/dL (ref 30.0–36.0)
MCV: 91.6 fL (ref 80.0–100.0)
Platelets: 192 10*3/uL (ref 150–400)
RBC: 4.78 MIL/uL (ref 4.22–5.81)
RDW: 14.4 % (ref 11.5–15.5)
WBC: 7.9 10*3/uL (ref 4.0–10.5)
nRBC: 0 % (ref 0.0–0.2)

## 2021-04-16 LAB — PROTIME-INR
INR: 1 (ref 0.8–1.2)
Prothrombin Time: 13.5 seconds (ref 11.4–15.2)

## 2021-04-16 LAB — APTT: aPTT: 30 seconds (ref 24–36)

## 2021-04-27 ENCOUNTER — Other Ambulatory Visit: Payer: Self-pay

## 2021-04-27 ENCOUNTER — Encounter (HOSPITAL_BASED_OUTPATIENT_CLINIC_OR_DEPARTMENT_OTHER): Payer: Self-pay | Admitting: Urology

## 2021-04-27 DIAGNOSIS — K219 Gastro-esophageal reflux disease without esophagitis: Secondary | ICD-10-CM

## 2021-04-27 DIAGNOSIS — I10 Essential (primary) hypertension: Secondary | ICD-10-CM

## 2021-04-27 DIAGNOSIS — R32 Unspecified urinary incontinence: Secondary | ICD-10-CM

## 2021-04-27 DIAGNOSIS — Z973 Presence of spectacles and contact lenses: Secondary | ICD-10-CM

## 2021-04-27 DIAGNOSIS — E1140 Type 2 diabetes mellitus with diabetic neuropathy, unspecified: Secondary | ICD-10-CM

## 2021-04-27 DIAGNOSIS — C61 Malignant neoplasm of prostate: Secondary | ICD-10-CM

## 2021-04-27 HISTORY — DX: Malignant neoplasm of prostate: C61

## 2021-04-27 HISTORY — DX: Gastro-esophageal reflux disease without esophagitis: K21.9

## 2021-04-27 HISTORY — DX: Essential (primary) hypertension: I10

## 2021-04-27 HISTORY — DX: Unspecified urinary incontinence: R32

## 2021-04-27 HISTORY — DX: Presence of spectacles and contact lenses: Z97.3

## 2021-04-27 HISTORY — DX: Type 2 diabetes mellitus with diabetic neuropathy, unspecified: E11.40

## 2021-04-27 NOTE — Progress Notes (Signed)
Spoke w/ via phone for pre-op interview---pt Lab needs dos----   none            Lab results------cbc cmp pt ptt all done 04-16-2021 epic COVID test -----patient states asymptomatic no test needed Arrive at -------530 am 04-30-2021 NPO after MN NO Solid Food.  Clear liquids from MN until---430 am then npo Med rec completed Medications to take morning of surgery -----atorvastatin, metorpolol succinate, pantaprazole, solifenacin (vesicare) Diabetic medication -----none day of surgery Patient instructed no nail polish to be worn day of surgery Patient instructed to bring photo id and insurance card day of surgery Patient aware to have Driver (ride ) / caregiver  friend Truman Hayward   for 24 hours after surgery  Patient Special Instructions -----fleets enema am of surgery Pre-Op special Istructions -----none Patient verbalized understanding of instructions that were given at this phone interview. Patient denies shortness of breath, chest pain, fever, cough at this phone interview.   Anesthesia Review: hx of cad with severe proximal lad disease and des to mid lad, htn , dm type 2 , pt denies any chest pain sob, or cardiac S & S at pre op phone call  PCP: dr [name] griffin lov 08-08-2020 on chart Cardiologist : last saw dr Geraldo Pitter 10-06-2018 epic, follows up with dr [name] griffin now Chest x-ray :02-12-2021 epic EKG :02-12-2021 epic Echo :none Stress test:none Cardiac Cath : 09-29-2012 epic Activity level: works full time can climb flight of steps without difficulty per pt Sleep Study/ CPAP :none Fasting Blood Sugar :pt does not check c ASA / Instructions/ Last Dose :  81 mg aspirin no instructions from dr Diona Fanti, last dose to be day before sirgery 04-29-2021

## 2021-04-29 ENCOUNTER — Telehealth: Payer: Self-pay | Admitting: *Deleted

## 2021-04-29 NOTE — Telephone Encounter (Signed)
CALLED PATIENT TO REMIND OF PROCEDURE FOR 04-30-21, SPOKE WITH PATIENT AND HE IS AWARE OF THIS PROCEDURE

## 2021-04-29 NOTE — Anesthesia Preprocedure Evaluation (Addendum)
Anesthesia Evaluation  Patient identified by MRN, date of birth, ID band Patient awake    Reviewed: Allergy & Precautions, NPO status , Patient's Chart, lab work & pertinent test results  Airway Mallampati: II  TM Distance: >3 FB Neck ROM: Full    Dental no notable dental hx. (+) Implants, Dental Advisory Given, Teeth Intact   Pulmonary neg pulmonary ROS, former smoker,    Pulmonary exam normal breath sounds clear to auscultation       Cardiovascular hypertension, Pt. on medications + CAD and + Cardiac Stents  Normal cardiovascular exam Rhythm:Regular Rate:Normal     Neuro/Psych negative neurological ROS     GI/Hepatic GERD  Medicated and Controlled,  Endo/Other  diabetes, Oral Hypoglycemic AgentsMorbid obesity (BMI 40)  Renal/GU Lab Results      Component                Value               Date                      CREATININE               1.06                04/16/2021                BUN                      15                  04/16/2021                NA                       140                 04/16/2021                K                        3.9                 04/16/2021                CL                       105                 04/16/2021                CO2                      24                  04/16/2021              Prostate CA    Musculoskeletal  (+) Arthritis ,   Abdominal (+) + obese,   Peds  Hematology Lab Results      Component                Value               Date                      WBC  7.9                 04/16/2021                HGB                      15.0                04/16/2021                HCT                      43.8                04/16/2021                MCV                      91.6                04/16/2021                PLT                      192                 04/16/2021              Anesthesia Other Findings    Reproductive/Obstetrics                            Anesthesia Physical Anesthesia Plan  ASA: 3  Anesthesia Plan: General   Post-op Pain Management:    Induction: Intravenous  PONV Risk Score and Plan: 3 and Treatment may vary due to age or medical condition, Midazolam and Ondansetron  Airway Management Planned: LMA  Additional Equipment: None  Intra-op Plan:   Post-operative Plan: Extubation in OR  Informed Consent: I have reviewed the patients History and Physical, chart, labs and discussed the procedure including the risks, benefits and alternatives for the proposed anesthesia with the patient or authorized representative who has indicated his/her understanding and acceptance.     Dental advisory given  Plan Discussed with: CRNA and Anesthesiologist  Anesthesia Plan Comments: (LMA GA)       Anesthesia Quick Evaluation
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
## 2021-04-30 ENCOUNTER — Ambulatory Visit (HOSPITAL_COMMUNITY): Payer: BC Managed Care – PPO

## 2021-04-30 ENCOUNTER — Ambulatory Visit (HOSPITAL_BASED_OUTPATIENT_CLINIC_OR_DEPARTMENT_OTHER)
Admission: RE | Admit: 2021-04-30 | Discharge: 2021-04-30 | Disposition: A | Discharge status: Home / Self Care | Payer: BC Managed Care – PPO | Attending: Urology | Admitting: Urology

## 2021-04-30 ENCOUNTER — Ambulatory Visit (HOSPITAL_BASED_OUTPATIENT_CLINIC_OR_DEPARTMENT_OTHER): Payer: BC Managed Care – PPO | Admitting: Anesthesiology

## 2021-04-30 ENCOUNTER — Encounter (HOSPITAL_BASED_OUTPATIENT_CLINIC_OR_DEPARTMENT_OTHER)
Admission: RE | Disposition: A | Discharge status: Home / Self Care | Payer: Self-pay | Source: Home / Self Care | Attending: Urology

## 2021-04-30 ENCOUNTER — Encounter (HOSPITAL_BASED_OUTPATIENT_CLINIC_OR_DEPARTMENT_OTHER): Payer: Self-pay | Admitting: Urology

## 2021-04-30 DIAGNOSIS — C61 Malignant neoplasm of prostate: Secondary | ICD-10-CM | POA: Insufficient documentation

## 2021-04-30 DIAGNOSIS — Z96653 Presence of artificial knee joint, bilateral: Secondary | ICD-10-CM | POA: Diagnosis not present

## 2021-04-30 DIAGNOSIS — Z888 Allergy status to other drugs, medicaments and biological substances status: Secondary | ICD-10-CM | POA: Insufficient documentation

## 2021-04-30 DIAGNOSIS — Z955 Presence of coronary angioplasty implant and graft: Secondary | ICD-10-CM | POA: Insufficient documentation

## 2021-04-30 DIAGNOSIS — Z87891 Personal history of nicotine dependence: Secondary | ICD-10-CM | POA: Insufficient documentation

## 2021-04-30 HISTORY — PX: RADIOACTIVE SEED IMPLANT: SHX5150

## 2021-04-30 HISTORY — PX: SPACE OAR INSTILLATION: SHX6769

## 2021-04-30 HISTORY — DX: Atherosclerotic heart disease of native coronary artery without angina pectoris: I25.10

## 2021-04-30 LAB — GLUCOSE, CAPILLARY
Glucose-Capillary: 160 mg/dL — ABNORMAL HIGH (ref 70–99)
Glucose-Capillary: 149 mg/dL — ABNORMAL HIGH (ref 70–99)

## 2021-04-30 SURGERY — INSERTION, RADIATION SOURCE, PROSTATE
Anesthesia: General | Site: Rectum

## 2021-04-30 MED ORDER — ONDANSETRON HCL 4 MG/2ML IJ SOLN
INTRAMUSCULAR | Status: AC
  Filled 2021-04-30: qty 2

## 2021-04-30 MED ORDER — SODIUM CHLORIDE 0.9 % IV SOLN
INTRAVENOUS | Status: AC
  Filled 2021-04-30: qty 2

## 2021-04-30 MED ORDER — DEXAMETHASONE SODIUM PHOSPHATE 10 MG/ML IJ SOLN
INTRAMUSCULAR | Status: AC
  Filled 2021-04-30: qty 1

## 2021-04-30 MED ORDER — ONDANSETRON HCL 4 MG/2ML IJ SOLN: 4.0000 mg | Freq: Once | INTRAMUSCULAR | Status: DC | PRN

## 2021-04-30 MED ORDER — PHENYLEPHRINE 40 MCG/ML (10ML) SYRINGE FOR IV PUSH (FOR BLOOD PRESSURE SUPPORT)
PREFILLED_SYRINGE | INTRAVENOUS | Status: AC
  Filled 2021-04-30: qty 10

## 2021-04-30 MED ORDER — FENTANYL CITRATE (PF) 100 MCG/2ML IJ SOLN
INTRAMUSCULAR | Status: DC | PRN
  Administered 2021-04-30: 25 ug via INTRAVENOUS
  Administered 2021-04-30: 50 ug via INTRAVENOUS
  Administered 2021-04-30: 25 ug via INTRAVENOUS

## 2021-04-30 MED ORDER — SODIUM CHLORIDE 0.9 % IR SOLN
Status: DC | PRN
  Administered 2021-04-30: 1000 mL via INTRAVESICAL

## 2021-04-30 MED ORDER — LIDOCAINE 2% (20 MG/ML) 5 ML SYRINGE
INTRAMUSCULAR | Status: DC | PRN
  Administered 2021-04-30: 100 mg via INTRAVENOUS

## 2021-04-30 MED ORDER — ONDANSETRON HCL 4 MG/2ML IJ SOLN
INTRAMUSCULAR | Status: DC | PRN
  Administered 2021-04-30: 4 mg via INTRAVENOUS

## 2021-04-30 MED ORDER — LACTATED RINGERS IV SOLN: INTRAVENOUS | Status: DC

## 2021-04-30 MED ORDER — ARTIFICIAL TEARS OPHTHALMIC OINT
TOPICAL_OINTMENT | OPHTHALMIC | Status: AC
  Filled 2021-04-30: qty 3.5

## 2021-04-30 MED ORDER — OXYCODONE HCL 5 MG PO TABS: 5.0000 mg | ORAL_TABLET | Freq: Once | ORAL | Status: DC | PRN

## 2021-04-30 MED ORDER — LIDOCAINE HCL (PF) 2 % IJ SOLN
INTRAMUSCULAR | Status: AC
  Filled 2021-04-30: qty 5

## 2021-04-30 MED ORDER — AMISULPRIDE (ANTIEMETIC) 5 MG/2ML IV SOLN: 10.0000 mg | Freq: Once | INTRAVENOUS | Status: DC | PRN

## 2021-04-30 MED ORDER — ACETAMINOPHEN 10 MG/ML IV SOLN
INTRAVENOUS | Status: DC | PRN
  Administered 2021-04-30: 1000 mg via INTRAVENOUS

## 2021-04-30 MED ORDER — FENTANYL CITRATE (PF) 100 MCG/2ML IJ SOLN
INTRAMUSCULAR | Status: AC
  Filled 2021-04-30: qty 2

## 2021-04-30 MED ORDER — PHENYLEPHRINE 40 MCG/ML (10ML) SYRINGE FOR IV PUSH (FOR BLOOD PRESSURE SUPPORT)
PREFILLED_SYRINGE | INTRAVENOUS | Status: DC | PRN
  Administered 2021-04-30: 120 ug via INTRAVENOUS
  Administered 2021-04-30: 80 ug via INTRAVENOUS

## 2021-04-30 MED ORDER — SODIUM CHLORIDE (PF) 0.9 % IJ SOLN
INTRAMUSCULAR | Status: DC | PRN
  Administered 2021-04-30: 10 mL

## 2021-04-30 MED ORDER — ACETAMINOPHEN 10 MG/ML IV SOLN
INTRAVENOUS | Status: AC
  Filled 2021-04-30: qty 100

## 2021-04-30 MED ORDER — IOHEXOL 300 MG/ML  SOLN
INTRAMUSCULAR | Status: DC | PRN
  Administered 2021-04-30: 7 mL

## 2021-04-30 MED ORDER — OXYCODONE HCL 5 MG/5ML PO SOLN: 5.0000 mg | Freq: Once | ORAL | Status: DC | PRN

## 2021-04-30 MED ORDER — ACETAMINOPHEN 10 MG/ML IV SOLN: 1000.0000 mg | Freq: Once | INTRAVENOUS | Status: DC | PRN

## 2021-04-30 MED ORDER — EPHEDRINE 5 MG/ML INJ
INTRAVENOUS | Status: AC
  Filled 2021-04-30: qty 5

## 2021-04-30 MED ORDER — PROPOFOL 10 MG/ML IV BOLUS
INTRAVENOUS | Status: AC
  Filled 2021-04-30: qty 40

## 2021-04-30 MED ORDER — DEXAMETHASONE SODIUM PHOSPHATE 10 MG/ML IJ SOLN
INTRAMUSCULAR | Status: DC | PRN
  Administered 2021-04-30: 5 mg via INTRAVENOUS

## 2021-04-30 MED ORDER — SODIUM CHLORIDE 0.9 % IV SOLN
2.0000 g | Freq: Once | INTRAVENOUS | Status: AC
  Administered 2021-04-30: 2 g via INTRAVENOUS
  Filled 2021-04-30: qty 2

## 2021-04-30 MED ORDER — FLEET ENEMA 7-19 GM/118ML RE ENEM: 1.0000 | ENEMA | Freq: Once | RECTAL | Status: DC

## 2021-04-30 MED ORDER — PROPOFOL 10 MG/ML IV BOLUS
INTRAVENOUS | Status: DC | PRN
  Administered 2021-04-30: 150 mg via INTRAVENOUS
  Administered 2021-04-30: 20 mg via INTRAVENOUS

## 2021-04-30 MED ORDER — KETOROLAC TROMETHAMINE 30 MG/ML IJ SOLN
INTRAMUSCULAR | Status: AC
  Filled 2021-04-30: qty 1

## 2021-04-30 MED ORDER — HYDROMORPHONE HCL 1 MG/ML IJ SOLN: 0.2500 mg | INTRAMUSCULAR | Status: DC | PRN

## 2021-04-30 MED ORDER — KETOROLAC TROMETHAMINE 15 MG/ML IJ SOLN
INTRAMUSCULAR | Status: DC | PRN
  Administered 2021-04-30: 15 mg via INTRAVENOUS

## 2021-04-30 MED ORDER — EPHEDRINE SULFATE-NACL 50-0.9 MG/10ML-% IV SOSY
PREFILLED_SYRINGE | INTRAVENOUS | Status: DC | PRN
  Administered 2021-04-30: 10 mg via INTRAVENOUS

## 2021-04-30 SURGICAL SUPPLY — 37 items
BAG DRN RND TRDRP ANRFLXCHMBR (UROLOGICAL SUPPLIES) ×2
BAG URINE DRAIN 2000ML AR STRL (UROLOGICAL SUPPLIES) ×3 IMPLANT
BLADE CLIPPER SENSICLIP SURGIC (BLADE) ×3 IMPLANT
CATH FOLEY 2WAY SLVR  5CC 16FR (CATHETERS) ×3
CATH FOLEY 2WAY SLVR 5CC 16FR (CATHETERS) ×2 IMPLANT
CATH ROBINSON RED A/P 16FR (CATHETERS) IMPLANT
CATH ROBINSON RED A/P 20FR (CATHETERS) ×3 IMPLANT
CLOTH BEACON ORANGE TIMEOUT ST (SAFETY) ×3 IMPLANT
CNTNR URN SCR LID CUP LEK RST (MISCELLANEOUS) ×4 IMPLANT
CONT SPEC 4OZ STRL OR WHT (MISCELLANEOUS) ×6
COVER BACK TABLE 60X90IN (DRAPES) ×3 IMPLANT
COVER MAYO STAND STRL (DRAPES) ×3 IMPLANT
DRAPE C-ARM 35X43 STRL (DRAPES) ×3 IMPLANT
DRSG COVADERM PLUS 2X2 (GAUZE/BANDAGES/DRESSINGS) ×1 IMPLANT
DRSG TEGADERM 4X4.75 (GAUZE/BANDAGES/DRESSINGS) ×3 IMPLANT
DRSG TEGADERM 8X12 (GAUZE/BANDAGES/DRESSINGS) ×3 IMPLANT
GAUZE SPONGE 4X4 12PLY STRL LF (GAUZE/BANDAGES/DRESSINGS) ×1 IMPLANT
GLOVE SURG ENC MOIS LTX SZ6.5 (GLOVE) ×3 IMPLANT
GLOVE SURG ENC MOIS LTX SZ7.5 (GLOVE) ×2 IMPLANT
GLOVE SURG ENC MOIS LTX SZ8 (GLOVE) ×6 IMPLANT
GLOVE SURG ORTHO LTX SZ8.5 (GLOVE) IMPLANT
GLOVE SURG UNDER POLY LF SZ6.5 (GLOVE) IMPLANT
GOWN STRL REUS W/TWL XL LVL3 (GOWN DISPOSABLE) ×3 IMPLANT
HOLDER FOLEY CATH W/STRAP (MISCELLANEOUS) ×3 IMPLANT
I SEED AGX 100 ×70 IMPLANT
IMPL SPACEOAR VUE SYSTEM (Spacer) IMPLANT
IMPLANT SPACEOAR VUE SYSTEM (Spacer) ×3 IMPLANT
IV NS 1000ML (IV SOLUTION) ×3
IV NS 1000ML BAXH (IV SOLUTION) ×2 IMPLANT
KIT TURNOVER CYSTO (KITS) ×3 IMPLANT
MARKER SKIN DUAL TIP RULER LAB (MISCELLANEOUS) ×3 IMPLANT
PACK CYSTO (CUSTOM PROCEDURE TRAY) ×3 IMPLANT
SUT BONE WAX W31G (SUTURE) IMPLANT
SYR 10ML LL (SYRINGE) ×3 IMPLANT
TOWEL OR 17X26 10 PK STRL BLUE (TOWEL DISPOSABLE) ×3 IMPLANT
UNDERPAD 30X36 HEAVY ABSORB (UNDERPADS AND DIAPERS) ×6 IMPLANT
WATER STERILE IRR 500ML POUR (IV SOLUTION) ×3 IMPLANT

## 2021-04-30 NOTE — Discharge Instructions (Addendum)
Radioactive Seed Implant Home Care Instructions   Activity:    Rest for the remainder of the day.  Do not drive or operate equipment today.  You may resume normal  activities in a few days as instructed by your physician, without risk of harmful radiation exposure to those around you, provided you follow the time and distance precautions on the Radiation Oncology Instruction Sheet.   Meals: Drink plenty of lipuids and eat light foods, such as gelatin or soup this evening .  You may return to normal meal plan tomorrow.  Return To Work: You may return to work as instructed by Naval architect.  Special Instruction:   If any seeds are found, use tweezers to pick up seeds and place in a glass container of any kind and bring to your physician's office.  Call your physician if any of these symptoms occur:  Persistent or heavy bleeding Urine stream diminishes or stops completely after catheter is removed Fever equal to or greater than 101 degrees F Cloudy urine with a strong foul odor Severe pain  You may feel some burning pain and/or hesitancy when you urinate after the catheter is removed.  These symptoms may increase over the next few weeks, but should diminish within forur to six weeks.  Applying moist heat to the lower abdomen or a hot tub bath may help relieve the pain.  If the discomfort becomes severe, please call your physician for additional medications.      Radioactive Seed Implant Home Care Instructions   Activity:    Rest for the remainder of the day.  Do not drive or operate equipment today.  You may resume normal  activities in a few days as instructed by your physician, without risk of harmful radiation exposure to those around you, provided you follow the time and distance precautions on the Radiation Oncology Instruction Sheet.   Meals: Drink plenty of lipuids and eat light foods, such as gelatin or soup this evening .  You may return to normal meal plan  tomorrow.  Return To Work: You may return to work as instructed by Naval architect.  Special Instruction:   If any seeds are found, use tweezers to pick up seeds and place in a glass container of any kind and bring to your physician's office.  Call your physician if any of these symptoms occur:  Persistent or heavy bleeding Urine stream diminishes or stops completely after catheter is removed Fever equal to or greater than 101 degrees F Cloudy urine with a strong foul odor Severe pain  You may feel some burning pain and/or hesitancy when you urinate after the catheter is removed.  These symptoms may increase over the next few weeks, but should diminish within forur to six weeks.  Applying moist heat to the lower abdomen or a hot tub bath may help relieve the pain.  If the discomfort becomes severe, please call your physician for additional medications.     PROSTATE CANCER TREATMENT WITH RADIOACTIVE IODINE-125 SEED IMPLANT  This instruction sheet is intended to discuss implantation of Iodine-125 seeds as treatment for cancer of the prostate. It will explain in detail what you may expect from this treatment and what precautions are necessary as a result of the treatment. Iodine-125 emits a relatively low energy radiation. The radioactive seeds are surgically implanted directly into the prostate gland. Most of the radiation is contained within the prostate gland. A very small amount is present outside the body.The precautions that we ask  you to take are to ensure that those around you are protected from unnecessary radiation. The principles of radiation safety that you need to understand are:  DISTANCE: The further a person is from the radioactive implant the less radiation they will be receiving. The amount of radiation received falls off quite rapidly with distance. More specific guidelines are given in the table on the last page.  TIME: The amount of radiation a person is exposed to is  directly proportional to the amount of time that is spent in close proximity to the radioactive implant. Very little radiation will be received during short periods. See the table on the last page for more specific guideline.  CHILDREN UNDER AGE 80 Children should not be allowed to sit on your lap or otherwise be in very close contact for more than a few minutes for the first 6-8 weeks following the implant. You may affectionately greet (hug/kiss) a child for a short period of time, but remember, the longer you are in close proximity with that child the more radiation they are being exposed to. At a distance of 6 feet there is no limit to the length of time you may spend together. See specific guidelines on the last page.  PREGNANT OR POSSIBLY PREGNANT WOMEN Pregnant women should avoid prolonged close physical contact with you for the first 6-8 weeks after implant. At a distance of 6 feet there is no limit to the length of time you may spend together. Pregnant women or possibly pregnant women can safely be in close contact with you for a limited period of time. See the last page for guidelines.  FAMILY RELATIONS You may sleep in the same bed as your partner (provided she is not pregnant or under the age of 70). Sexual intercourse, using a condom, may be resumed 2 weeks after the implant. Your semen may be discolored, dark brown or black. This is normal and is the result of bleeding that may have occurred during the implant. After 3-4 weeks it will not be necessary to use a condom.  DAILY ACTIVITIES You may resume normal activities in a few days (example: work, shopping, church) without the risk of harmful radiation exposure to those around you provided you keep in mind the time and distance precautions. Objects that you touch or item that you use do not become radioactive. Linens, clothing, tableware, and dishes may be used by other persons without special precautions. Your bodily wastes (urine and  stool) are not radioactive.  SPECIAL PRECAUTIONS It is possible to lose implanted Iodine-125 seed(s) through urination. Although it is possible to pass seeds indefinitely, it is most likely to occur immediately after catheter removal. To prevent this from happening the catheter that was in place during the implant procedure is removed immediately after the implant and a cystoscopy procedure is performed. The process of removing the catheter and the cystoscopy procedure should dislodge and remove any seeds that are not firmly imbedded in the prostate tissue. However, you should watch for seeds if/when you remove your catheter at home. The seeds are silver colored and the size of a grain of rice. In the unlikely event that a seed is seen after urination, simply flush the seed down the toilet. The seed should not be handled with your fingers, not even with a glove or napkin. A spoon or tweezers can be used to pick up a seed. The Radiation Oncology department is open Monday - Friday from 8:00 am to 5:30 pm with a  Radiation Oncologist on call at all times. He or she may be reached by calling [phone]. If you are to be hospitalized or if death should occur, your family should notify the Runner, broadcasting/film/video.  SIDE EFFECTS There are very few side effects associate with the implant procedure. Minor burning with urination, weak stream, hesitancy, intermittency, frequency, mild pain or feeling unable to pass your urine freely are common and usually stop in one to four months. If these symptoms are extremely uncomfortable, contact your physician.  RADIATION SAFETY GUIDELINES PROSTATE CANCER TREATMENT WITH RADIOACTIVE IODINE-125 SEED IMPLANT  The following guidelines will limit exposure to less than naturally occurring background radiation.  PERSONS AGE 60-45 (if able to become pregnant)  FOR 8 WEEKS FOLLOWING IMPLANT  At a distance of 1 foot: limit time to less than 2 hours/week At a distance of 3  feet: limit time to 20 hours/week At a distance of 6 feet: no restrictions  AFTER 8 WEEKS No restrictions  CHILDREN UNDER AGE 60, PREGNANT WOMEN OR POSSIBLY PREGNANT WOMEN  FOR 8 WEEKS FOLLOWING IMPLANT At a distance of 1 foot: limit time to 10 minutes/week At a distance of 3 feet: limit time to 2 hours/week At a distance of 6 feet: no restrictions  AFTER 8 WEEKS No restrictions  PERSONS OVER THE AGE OF 45 AND DO NOT EXPECT TO HAVE ANY MORE CHILDREN No restrictions  Updated by [facility] in January 2020  Post Anesthesia Home Care Instructions  Activity: Get plenty of rest for the remainder of the day. A responsible individual must stay with you for 24 hours following the procedure.  For the next 24 hours, DO NOT: -Drive a car -Paediatric nurse -Drink alcoholic beverages -Take any medication unless instructed by your physician -Make any legal decisions or sign important papers.  Meals: Start with liquid foods such as gelatin or soup. Progress to regular foods as tolerated. Avoid greasy, spicy, heavy foods. If nausea and/or vomiting occur, drink only clear liquids until the nausea and/or vomiting subsides. Call your physician if vomiting continues.  Special Instructions/Symptoms: Your throat may feel dry or sore from the anesthesia or the breathing tube placed in your throat during surgery. If this causes discomfort, gargle with warm salt water. The discomfort should disappear within 24 hours.

## 2021-04-30 NOTE — Op Note (Signed)
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 Jaeshawn Silvio M.D.  Radiation Oncologist: Tyler Pita, M.D.  Anesthesia: Gen.   Indications: Patient  was diagnosed with clinical stage TI C prostate cancer. We had extensive discussion with him about treatment options versus. He elected to proceed with seed implantation. He underwent consultation my office as well as with Dr. Tammi Klippel. He appeared to understand the advantages disadvantages potential risks of this treatment option. Full informed consent has been obtained.   Technique and findings: Patient was brought the operating room where he had successful induction of general anesthesia. He was placed in dorso-lithotomy position and prepped and draped in usual manner. Appropriate surgical timeout was performed. Radiation oncology department placed a transrectal ultrasound probe anchoring stand. Foley catheter with contrast in the balloon was inserted without difficulty. Anchoring needles were placed within the prostate. Rectal tube was placed. Real-time contouring of the urethra prostate and rectum were performed and the dosing parameters were established. Targeted dose was 145 gray.  I was then called  to the operating suite suite for placement of the needles. A second timeout was performed. All needle passage was done with real-time transrectal ultrasound guidance with the sagittal plane. A total of 18 needles were placed.  70 active seeds were implanted.  I then proceeded with placement of SpaceOAR by introducing a needle with the bevel angled inferiorly approximately 2 cm superior to the anus. This was angled downward and under direct ultrasound was placed within the space between the prostatic capsule and rectum. This was confirmed with a small amount of sterile saline injected and  this was performed under direct ultrasound. I then attached the SpaceOAR to the needle and injected this in the space between the prostate and rectum with good placement noted. The Foley catheter was removed and flexible cystoscopy failed to show any seeds outside the prostate.  The patient was brought to recovery room in stable condition, having tolerated the procedure well.Marland Kitchen

## 2021-04-30 NOTE — Progress Notes (Signed)
  Radiation Oncology         [phone] ________________________________  Name: Fernando Blankenship MRN: [id number]  Date: 04/30/2021  DOB: 1946/08/22       Prostate Seed Implant  [name], [name], MD  No ref. provider found  DIAGNOSIS:  Oncology History  Malignant neoplasm of prostate ([place])  10/31/2020 Cancer Staging   Staging form: Prostate, AJCC 8th Edition - Clinical stage from 10/31/2020: Stage IIB (cT1c, cN0, cM0, PSA: 5.5, Grade Group: 2) - Signed by Freeman Caldron, PA-C on 12/23/2020  Histopathologic type: Adenocarcinoma, NOS  Stage prefix: Initial diagnosis  Prostate specific antigen (PSA) range: Less than 10  Gleason primary pattern: 3  Gleason secondary pattern: 4  Gleason score: 7  Histologic grading system: 5 grade system  Number of biopsy cores examined: 12  Number of biopsy cores positive: 8  Location of positive needle core biopsies: Both sides    12/23/2020 Initial Diagnosis   Malignant neoplasm of prostate ([name])      No diagnosis found.  PROCEDURE: Insertion of radioactive I-125 seeds into the prostate gland.  RADIATION DOSE: 145 Gy, definitive therapy.  TECHNIQUE: [name] was brought to the operating room with the urologist. He was placed in the dorsolithotomy position. He was catheterized and a rectal tube was inserted. The perineum was shaved, prepped and draped. The ultrasound probe was then introduced into the rectum to see the prostate gland.  TREATMENT DEVICE: A needle grid was attached to the ultrasound probe stand and anchor needles were placed.  3D PLANNING: The prostate was imaged in 3D using a sagittal sweep of the prostate probe. These images were transferred to the planning computer. There, the prostate, urethra and rectum were defined on each axial reconstructed image. Then, the software created an optimized 3D plan and a few seed positions were adjusted. The quality of the plan was reviewed using [name] information for the target and  the following two organs at risk:  Urethra and Rectum.  Then the accepted plan was printed and handed off to the radiation therapist.  Under my supervision, the custom loading of the seeds and spacers was carried out and loaded into sealed vicryl sleeves.  These pre-loaded needles were then placed into the needle holder.Marland Kitchen  PROSTATE VOLUME STUDY:  Using transrectal ultrasound the volume of the prostate was verified to be 36.1 cc.  SPECIAL TREATMENT PROCEDURE/SUPERVISION AND HANDLING: The pre-loaded needles were then delivered under sagittal guidance. A total of 18 needles were used to deposit 70 seeds in the prostate gland. The individual seed activity was 0.384 mCi.  SpaceOAR:  Yes  COMPLEX SIMULATION: At the end of the procedure, an anterior radiograph of the pelvis was obtained to document seed positioning and count. Cystoscopy was performed to check the urethra and bladder.  MICRODOSIMETRY: At the end of the procedure, the patient was emitting 0.103 mR/hr at 1 meter. Accordingly, he was considered safe for hospital discharge.  PLAN: The patient will return to the radiation oncology clinic for post implant CT dosimetry in three weeks.   ________________________________  Sheral Apley Tammi Klippel, M.D.

## 2021-04-30 NOTE — Transfer of Care (Signed)
Immediate Anesthesia Transfer of Care Note  Patient: Fernando Blankenship  Procedure(s) Performed: Procedure(s) (LRB): RADIOACTIVE SEED IMPLANT/BRACHYTHERAPY IMPLANT (N/A) SPACE OAR INSTILLATION (N/A)  Patient Location: PACU  Anesthesia Type: General  Level of Consciousness: awake, alert  and oriented  Airway & Oxygen Therapy: Patient Spontanous Breathing and Patient connected to face mask oxygen  Post-op Assessment: Report given to PACU RN and Post -op Vital signs reviewed and stable  Post vital signs: Reviewed and stable  Complications: No apparent anesthesia complications  Last Vitals:  Vitals Value Taken Time  BP 139/75 04/30/21 0909  Temp    Pulse 72 04/30/21 0913  Resp 12 04/30/21 0913  SpO2 98 % 04/30/21 0913  Vitals shown include unvalidated device data.  Last Pain:  Vitals:   04/30/21 0609  TempSrc: Oral  PainSc: 0-No pain      Patients Stated Pain Goal: 8 ([id number])  Complications: No notable events documented.

## 2021-04-30 NOTE — Anesthesia Procedure Notes (Signed)
Procedure Name: LMA Insertion Date/Time: 04/30/2021 7:46 AM Performed by: Mechele Claude, CRNA Pre-anesthesia Checklist: Patient identified, Emergency Drugs available, Suction available and Patient being monitored Patient Re-evaluated:Patient Re-evaluated prior to induction Oxygen Delivery Method: Circle System Utilized Preoxygenation: Pre-oxygenation with 100% oxygen Induction Type: IV induction Ventilation: Mask ventilation without difficulty LMA: LMA with gastric port inserted LMA Size: 4.0 Number of attempts: 1 Placement Confirmation: positive ETCO2 Tube secured with: Tape Dental Injury: Teeth and Oropharynx as per pre-operative assessment

## 2021-04-30 NOTE — Anesthesia Postprocedure Evaluation (Signed)
Anesthesia Post Note  Patient: Fernando Blankenship  Procedure(s) Performed: RADIOACTIVE SEED IMPLANT/BRACHYTHERAPY IMPLANT (Prostate) SPACE OAR INSTILLATION (Rectum)     Patient location during evaluation: PACU Anesthesia Type: General Level of consciousness: awake and alert Pain management: pain level controlled Vital Signs Assessment: post-procedure vital signs reviewed and stable Respiratory status: spontaneous breathing, nonlabored ventilation, respiratory function stable and patient connected to nasal cannula oxygen Cardiovascular status: blood pressure returned to baseline and stable Postop Assessment: no apparent nausea or vomiting Anesthetic complications: no   No notable events documented.  Last Vitals:  Vitals:   04/30/21 0945 04/30/21 1028  BP: 128/71 (!) 145/73  Pulse: 63 60  Resp: (!) 9 14  Temp: 36.5 C 36.4 C  SpO2: 94% 98%    Last Pain:  Vitals:   04/30/21 1028  TempSrc: Oral  PainSc: 0-No pain                 Barnet Glasgow

## 2021-04-30 NOTE — Interval H&P Note (Signed)
History and Physical Interval Note:  04/30/2021 7:25 AM  Fernando Blankenship  has presented today for surgery, with the diagnosis of PROSTATE CANCER.  The various methods of treatment have been discussed with the patient and family. After consideration of risks, benefits and other options for treatment, the patient has consented to  Procedure(s) with comments: RADIOACTIVE SEED IMPLANT/BRACHYTHERAPY IMPLANT (N/A) - 90 MINS as a surgical intervention.  The patient's history has been reviewed, patient examined, no change in status, stable for surgery.  I have reviewed the patient's chart and labs.  Questions were answered to the patient's satisfaction.     Lillette Boxer Carneshia Raker

## 2021-05-01 ENCOUNTER — Encounter (HOSPITAL_BASED_OUTPATIENT_CLINIC_OR_DEPARTMENT_OTHER): Payer: Self-pay | Admitting: Urology

## 2021-05-19 ENCOUNTER — Telehealth: Payer: Self-pay | Admitting: *Deleted

## 2021-05-19 NOTE — Telephone Encounter (Signed)
CALLED PATIENT TO REMIND OF POST SEED APPTS. FOR 05-21-21, SPOKE WITH PATIENT AND HE IS AWARE OF THESE APPTS.

## 2021-05-21 ENCOUNTER — Other Ambulatory Visit: Payer: Self-pay

## 2021-05-21 ENCOUNTER — Encounter: Payer: Self-pay | Admitting: Urology

## 2021-05-21 ENCOUNTER — Ambulatory Visit
Admission: RE | Admit: 2021-05-21 | Discharge: 2021-05-21 | Disposition: A | Discharge status: Home / Self Care | Payer: BC Managed Care – PPO | Source: Ambulatory Visit | Attending: Urology | Admitting: Urology

## 2021-05-21 ENCOUNTER — Ambulatory Visit
Admission: RE | Admit: 2021-05-21 | Discharge: 2021-05-21 | Disposition: A | Discharge status: Home / Self Care | Payer: BC Managed Care – PPO | Source: Ambulatory Visit | Attending: Radiation Oncology | Admitting: Radiation Oncology

## 2021-05-21 DIAGNOSIS — R3911 Hesitancy of micturition: Secondary | ICD-10-CM | POA: Diagnosis not present

## 2021-05-21 DIAGNOSIS — Z79899 Other long term (current) drug therapy: Secondary | ICD-10-CM | POA: Diagnosis not present

## 2021-05-21 DIAGNOSIS — R350 Frequency of micturition: Secondary | ICD-10-CM | POA: Diagnosis not present

## 2021-05-21 DIAGNOSIS — Z7982 Long term (current) use of aspirin: Secondary | ICD-10-CM | POA: Insufficient documentation

## 2021-05-21 DIAGNOSIS — C61 Malignant neoplasm of prostate: Secondary | ICD-10-CM | POA: Insufficient documentation

## 2021-05-21 DIAGNOSIS — Z7984 Long term (current) use of oral hypoglycemic drugs: Secondary | ICD-10-CM | POA: Insufficient documentation

## 2021-05-21 NOTE — Progress Notes (Signed)
Patient reports fatigue, pain 10/10 (Bladder), dysuria, nocturia x5, frequency/ urgency, and a weak urine stream. Denies hematuria, diarrhea, constipation, and skin changes.  I-PSS score of 35 (severe). Meaningful use questions complete.  BP 94/61 (BP Location: Left Arm, Patient Position: Sitting, Cuff Size: Large)   Pulse 86   Temp 98.1 F (36.7 C) (Oral)   Resp 19   Ht '5\' 7"'$  (1.702 m)   Wt 248 lb 9.6 oz (112.8 kg)   SpO2 97%   BMI 38.94 kg/m

## 2021-05-21 NOTE — Progress Notes (Signed)
  Radiation Oncology         [phone] ________________________________  Name: Fernando Blankenship MRN: [id number]  Date: 05/21/2021  DOB: September 02, 1946  COMPLEX SIMULATION NOTE  NARRATIVE:  The patient was brought to the [facility] today following prostate seed implantation approximately one month ago.  Identity was confirmed.  All relevant records and images related to the planned course of therapy were reviewed.  Then, the patient was set-up supine.  CT images were obtained.  The CT images were loaded into the planning software.  Then the prostate and rectum were contoured.  Treatment planning then occurred.  The implanted iodine 125 seeds were identified by the physics staff for projection of radiation distribution  I have requested : 3D Simulation  I have requested a DVH of the following structures: Prostate and rectum.    ________________________________  Sheral Apley Tammi Klippel, M.D.

## 2021-05-21 NOTE — Progress Notes (Signed)
Radiation Oncology         [phone] ________________________________  Name: Fernando Blankenship MRN: [id number]  Date: 05/21/2021  DOB: July 10, 1946  Post-Seed Follow-Up Visit Note  CC: Lavone Orn, MD  Franchot Gallo, MD  Diagnosis:    75 y.o. gentleman with Stage T1c adenocarcinoma of the prostate with Gleason score of 3+4, and PSA of 5.48.    ICD-10-CM   1. Malignant neoplasm of prostate ([place])  C61       Interval Since Last Radiation:  3 weeks 04/30/21:  Insertion of radioactive I-125 seeds into the prostate gland; 145 Gy, definitive therapy with placement of SpaceOAR gel.  Narrative:  The patient returns today for routine follow-up.  He is complaining of increased urinary frequency and urinary hesitation symptoms. He filled out a questionnaire regarding urinary function today providing and overall IPSS score of 35 characterizing his symptoms as severe with urgency, frequency, weak stream, hesitancy, intermittency and incomplete bladder emptying. He has continued taking Vesicare daily as prescribed.  His pre-implant score was 14 with daytime frequency, urgency, nocturia x4, and intermittent urge incontinence despite Vesicare. He denies any abdominal pain or bowel symptoms.  ALLERGIES:  is allergic to prozac [fluoxetine] and other.  Meds: Current Outpatient Medications  Medication Sig Dispense Refill   acetaminophen (TYLENOL) 500 MG tablet Take 500 mg by mouth every 6 (six) hours as needed.     Alpha-Lipoic Acid 100 MG CAPS 600 mg     aspirin 81 MG chewable tablet Chew 1 tablet (81 mg total) by mouth daily.     atorvastatin (LIPITOR) 20 MG tablet Take 1 tablet (20 mg total) by mouth daily. 90 tablet 2   Continuous Blood Gluc Receiver (DEXCOM G6 RECEIVER) DEVI USE AS DIRECTED TO MONITOR BLOOD SUGAR     empagliflozin (JARDIANCE) 25 MG TABS tablet 1 tablet     ibuprofen (ADVIL,MOTRIN) 200 MG tablet Take 800 mg by mouth every 8 (eight) hours as needed. For pain     metFORMIN  (GLUCOPHAGE-XR) 500 MG 24 hr tablet Take by mouth.     metoprolol succinate (TOPROL-XL) 25 MG 24 hr tablet Take 1 tablet (25 mg total) by mouth daily. 90 tablet 1   Multiple Vitamin (MULTIVITAMINS PO) 1 tablet     nitroGLYCERIN (NITROSTAT) 0.4 MG SL tablet TAKE 1 TABLET AS NEEDED AS DIRECTED 25 tablet 5   ONETOUCH VERIO test strip 2 (two) times daily.     oxybutynin (DITROPAN) 5 MG tablet Take 5 mg by mouth as needed for bladder spasms.     pantoprazole (PROTONIX) 40 MG tablet Take 1 tablet by mouth daily.     rOPINIRole (REQUIP) 1 MG tablet Take 1 mg by mouth at bedtime.     solifenacin (VESICARE) 10 MG tablet Take 10 mg by mouth daily.     triamterene-hydrochlorothiazide (MAXZIDE-25) 37.5-25 MG tablet Take 1 tablet by mouth every morning.     No current facility-administered medications for this encounter.    Physical Findings: In general this is a well appearing Caucasian male in no acute distress. He's alert and oriented x4 and appropriate throughout the examination. Cardiopulmonary assessment is negative for acute distress and he exhibits normal effort.   Lab Findings: Lab Results  Component Value Date   WBC 7.9 04/16/2021   HGB 15.0 04/16/2021   HCT 43.8 04/16/2021   MCV 91.6 04/16/2021   PLT 192 04/16/2021    Radiographic Findings:  Patient underwent CT imaging in our clinic for post implant dosimetry. The  CT will be reviewed by Dr. Tammi Klippel to confirm there is an adequate distribution of radioactive seeds throughout the prostate gland and ensure that there are no seeds in or near the rectum.  We suspect the final radiation plan and dosimetry will show appropriate coverage of the prostate gland. He understands that we will call and inform him of any unexpected findings on further review of his imaging and dosimetry.  Impression/Plan:  75 y.o. gentleman with Stage T1c adenocarcinoma of the prostate with Gleason score of 3+4, and PSA of 5.48. The patient is recovering from the  effects of radiation. His urinary symptoms should gradually improve over the next 4-6 months. We talked about this today. He is encouraged by his improvement already and is otherwise pleased with his outcome. We also talked about long-term follow-up for prostate cancer following seed implant. He understands that ongoing PSA determinations and digital rectal exams will help perform surveillance to rule out disease recurrence. He has a follow up appointment scheduled with Jiles Crocker, NP at 2 pm this afternoon. He understands what to expect with his PSA measures. Patient was also educated today about some of the long-term effects from radiation including a small risk for rectal bleeding and possibly erectile dysfunction. We talked about some of the general management approaches to these potential complications. However, I did encourage the patient to contact our office or return at any point if he has questions or concerns related to his previous radiation and prostate cancer.    Nicholos Johns, PA-C

## 2021-06-05 ENCOUNTER — Ambulatory Visit
Admission: RE | Admit: 2021-06-05 | Discharge: 2021-06-05 | Disposition: A | Discharge status: Home / Self Care | Payer: BC Managed Care – PPO | Source: Ambulatory Visit | Attending: Radiation Oncology | Admitting: Radiation Oncology

## 2021-06-05 ENCOUNTER — Encounter: Payer: Self-pay | Admitting: Radiation Oncology

## 2021-06-05 DIAGNOSIS — C61 Malignant neoplasm of prostate: Secondary | ICD-10-CM | POA: Diagnosis present

## 2021-06-30 NOTE — Progress Notes (Signed)
  Radiation Oncology         [phone] ________________________________  Name: Fernando Blankenship MRN: [id number]  Date: 06/05/2021  DOB: Jan 01, 1946  3D Planning Note   Prostate Brachytherapy Post-Implant Dosimetry  Diagnosis: 75 y.o. gentleman with Stage T1c adenocarcinoma of the prostate with Gleason score of 3+4, and PSA of 5.48.  Narrative: On a previous date, Fernando Blankenship returned following prostate seed implantation for post implant planning. He underwent CT scan complex simulation to delineate the three-dimensional structures of the pelvis and demonstrate the radiation distribution.  Since that time, the seed localization, and complex isodose planning with dose volume histograms have now been completed.  Results:   Prostate Coverage - The dose of radiation delivered to the 90% or more of the prostate gland (D90) was 97.9% of the prescription dose. This exceeds our goal of greater than 90%. Rectal Sparing - The volume of rectal tissue receiving the prescription dose or higher was 0.01 cc. This falls under our thresholds tolerance of 1.0 cc.  Impression: The prostate seed implant appears to show adequate target coverage and appropriate rectal sparing.  Plan:  The patient will continue to follow with urology for ongoing PSA determinations. I would anticipate a high likelihood for local tumor control with minimal risk for rectal morbidity.  ________________________________  Sheral Apley Tammi Klippel, M.D.

## 2021-07-01 ENCOUNTER — Emergency Department (HOSPITAL_COMMUNITY)
Admission: EM | Admit: 2021-07-01 | Discharge: 2021-07-02 | Disposition: A | Discharge status: Home / Self Care | Payer: BC Managed Care – PPO | Attending: Emergency Medicine | Admitting: Emergency Medicine

## 2021-07-01 ENCOUNTER — Other Ambulatory Visit: Payer: Self-pay

## 2021-07-01 ENCOUNTER — Encounter (HOSPITAL_COMMUNITY): Payer: Self-pay

## 2021-07-01 DIAGNOSIS — Z7982 Long term (current) use of aspirin: Secondary | ICD-10-CM | POA: Diagnosis not present

## 2021-07-01 DIAGNOSIS — R339 Retention of urine, unspecified: Secondary | ICD-10-CM | POA: Diagnosis not present

## 2021-07-01 DIAGNOSIS — Z8546 Personal history of malignant neoplasm of prostate: Secondary | ICD-10-CM | POA: Diagnosis not present

## 2021-07-01 DIAGNOSIS — Z87891 Personal history of nicotine dependence: Secondary | ICD-10-CM | POA: Insufficient documentation

## 2021-07-01 DIAGNOSIS — Z79899 Other long term (current) drug therapy: Secondary | ICD-10-CM | POA: Insufficient documentation

## 2021-07-01 DIAGNOSIS — I2510 Atherosclerotic heart disease of native coronary artery without angina pectoris: Secondary | ICD-10-CM | POA: Diagnosis not present

## 2021-07-01 DIAGNOSIS — Z96653 Presence of artificial knee joint, bilateral: Secondary | ICD-10-CM | POA: Insufficient documentation

## 2021-07-01 DIAGNOSIS — Z7984 Long term (current) use of oral hypoglycemic drugs: Secondary | ICD-10-CM | POA: Insufficient documentation

## 2021-07-01 DIAGNOSIS — I119 Hypertensive heart disease without heart failure: Secondary | ICD-10-CM | POA: Insufficient documentation

## 2021-07-01 LAB — URINALYSIS, ROUTINE W REFLEX MICROSCOPIC
Bacteria, UA: NONE SEEN
Bilirubin Urine: NEGATIVE
Glucose, UA: >=500 mg/dL — AB
Ketones, ur: NEGATIVE mg/dL
Nitrite: NEGATIVE
Protein, ur: >=300 mg/dL — AB
RBC / HPF: >50 RBC/hpf — ABNORMAL HIGH (ref 0–5)
Specific Gravity, Urine: 1.02 (ref 1.005–1.030)
WBC, UA: >50 WBC/hpf — ABNORMAL HIGH (ref 0–5)
pH: 6 (ref 5.0–8.0)

## 2021-07-01 MED ORDER — CEFTRIAXONE SODIUM 1 G IJ SOLR
1.0000 g | Freq: Once | INTRAMUSCULAR | Status: AC
  Administered 2021-07-01: 1 g via INTRAMUSCULAR
  Filled 2021-07-01: qty 10

## 2021-07-01 MED ORDER — CEFPODOXIME PROXETIL 200 MG PO TABS: 200.0000 mg | ORAL_TABLET | Freq: Two times a day (BID) | ORAL | 0 refills | Status: DC

## 2021-07-01 MED ORDER — STERILE WATER FOR INJECTION IJ SOLN
INTRAMUSCULAR | Status: AC
  Administered 2021-07-01: 1.2 mL
  Filled 2021-07-01: qty 10

## 2021-07-01 NOTE — ED Notes (Signed)
354 ml on bladder scan.

## 2021-07-01 NOTE — ED Triage Notes (Signed)
Pt complains of being unable to urinate since 6 pm. Pt states that his doctor told him to come here, get a foley, and report to him tomorrow.

## 2021-07-01 NOTE — Discharge Instructions (Signed)
Keep your appointment with your urologist tomorrow.  Take the antibiotics as prescribed.  Return immediately back to the ER if:  Your symptoms worsen within the next 12-24 hours. You develop new symptoms such as new fevers, persistent vomiting, new pain, shortness of breath, or new weakness or numbness, or if you have any other concerns.

## 2021-07-01 NOTE — ED Provider Notes (Signed)
Emergency Medicine Provider Triage Evaluation Note  Fernando Blankenship , a 75 y.o. male  was evaluated in triage.  Pt complains of difficulty urinating.  Patient has a history of enlarged prostate status post seed placement on August 4.  Presents today due to urinary retention.  States that he actually had a Foley catheter in place which was removed this morning.  After it was removed he was able to void normally and was sent home.  He then once again developed difficulty with urination and had an In-N-Out catheter performed this afternoon which produced about 1/2 L of urine, per patient.  He states since this was done he went home and has been unable to urinate.  Reports worsening lower abdominal pain.  Physical Exam  BP 140/89 (BP Location: Left Arm)   Pulse (!) 125   Temp 98.3 F (36.8 C) (Oral)   Resp 18   SpO2 94%  Gen:   Awake, no distress   Resp:  Normal effort  MSK:   Moves extremities without difficulty  Other:    Medical Decision Making  Medically screening exam initiated at 8:19 PM.  Appropriate orders placed.  [name] was informed that the remainder of the evaluation will be completed by another provider, this initial triage assessment does not replace that evaluation, and the importance of remaining in the ED until their evaluation is complete.   Rayna Sexton, PA-C 07/01/21 2020    Luna Fuse, MD 07/01/21 2348
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
## 2021-07-02 MED ORDER — CEFPODOXIME PROXETIL 200 MG PO TABS: 200.0000 mg | ORAL_TABLET | Freq: Two times a day (BID) | ORAL | 0 refills | Status: AC

## 2021-07-03 LAB — URINE CULTURE: Culture: NO GROWTH

## 2021-07-07 ENCOUNTER — Other Ambulatory Visit: Payer: Self-pay

## 2021-07-07 ENCOUNTER — Encounter (HOSPITAL_COMMUNITY): Payer: Self-pay

## 2021-07-07 ENCOUNTER — Emergency Department (HOSPITAL_COMMUNITY)
Admission: EM | Admit: 2021-07-07 | Discharge: 2021-07-08 | Disposition: A | Discharge status: Home / Self Care | Payer: BC Managed Care – PPO | Attending: Emergency Medicine | Admitting: Emergency Medicine

## 2021-07-07 DIAGNOSIS — Z8546 Personal history of malignant neoplasm of prostate: Secondary | ICD-10-CM | POA: Insufficient documentation

## 2021-07-07 DIAGNOSIS — E1140 Type 2 diabetes mellitus with diabetic neuropathy, unspecified: Secondary | ICD-10-CM | POA: Diagnosis not present

## 2021-07-07 DIAGNOSIS — I10 Essential (primary) hypertension: Secondary | ICD-10-CM | POA: Insufficient documentation

## 2021-07-07 DIAGNOSIS — I2510 Atherosclerotic heart disease of native coronary artery without angina pectoris: Secondary | ICD-10-CM | POA: Insufficient documentation

## 2021-07-07 DIAGNOSIS — Z7984 Long term (current) use of oral hypoglycemic drugs: Secondary | ICD-10-CM | POA: Diagnosis not present

## 2021-07-07 DIAGNOSIS — Z7982 Long term (current) use of aspirin: Secondary | ICD-10-CM | POA: Diagnosis not present

## 2021-07-07 DIAGNOSIS — R339 Retention of urine, unspecified: Secondary | ICD-10-CM | POA: Insufficient documentation

## 2021-07-07 DIAGNOSIS — Z87891 Personal history of nicotine dependence: Secondary | ICD-10-CM | POA: Insufficient documentation

## 2021-07-07 DIAGNOSIS — Z79899 Other long term (current) drug therapy: Secondary | ICD-10-CM | POA: Diagnosis not present

## 2021-07-07 DIAGNOSIS — Z96653 Presence of artificial knee joint, bilateral: Secondary | ICD-10-CM | POA: Diagnosis not present

## 2021-07-07 LAB — CBC WITH DIFFERENTIAL/PLATELET
Abs Immature Granulocytes: 0.05 10*3/uL (ref 0.00–0.07)
Basophils Absolute: 0.1 10*3/uL (ref 0.0–0.1)
Basophils Relative: 1 %
Eosinophils Absolute: 0.6 10*3/uL — ABNORMAL HIGH (ref 0.0–0.5)
Eosinophils Relative: 5 %
HCT: 42.9 % (ref 39.0–52.0)
Hemoglobin: 14.9 g/dL (ref 13.0–17.0)
Immature Granulocytes: 1 %
Lymphocytes Relative: 12 %
Lymphs Abs: 1.3 10*3/uL (ref 0.7–4.0)
MCH: 31 pg (ref 26.0–34.0)
MCHC: 34.7 g/dL (ref 30.0–36.0)
MCV: 89.4 fL (ref 80.0–100.0)
Monocytes Absolute: 0.7 10*3/uL (ref 0.1–1.0)
Monocytes Relative: 6 %
Neutro Abs: 7.7 10*3/uL (ref 1.7–7.7)
Neutrophils Relative %: 75 %
Platelets: 213 10*3/uL (ref 150–400)
RBC: 4.8 MIL/uL (ref 4.22–5.81)
RDW: 13.7 % (ref 11.5–15.5)
WBC: 10.3 10*3/uL (ref 4.0–10.5)
nRBC: 0 % (ref 0.0–0.2)

## 2021-07-07 LAB — COMPREHENSIVE METABOLIC PANEL
ALT: 42 U/L (ref 0–44)
AST: 30 U/L (ref 15–41)
Albumin: 4.1 g/dL (ref 3.5–5.0)
Alkaline Phosphatase: 65 U/L (ref 38–126)
Anion gap: 12 (ref 5–15)
BUN: 17 mg/dL (ref 8–23)
CO2: 24 mmol/L (ref 22–32)
Calcium: 9.8 mg/dL (ref 8.9–10.3)
Chloride: 99 mmol/L (ref 98–111)
Creatinine, Ser: 1.2 mg/dL (ref 0.61–1.24)
GFR, Estimated: >60 mL/min (ref >60)
Glucose, Bld: 303 mg/dL — ABNORMAL HIGH (ref 70–99)
Potassium: 3.6 mmol/L (ref 3.5–5.1)
Sodium: 135 mmol/L (ref 135–145)
Total Bilirubin: 1.1 mg/dL (ref 0.3–1.2)
Total Protein: 7.2 g/dL (ref 6.5–8.1)

## 2021-07-07 NOTE — ED Provider Notes (Signed)
Emergency Medicine Provider Triage Evaluation Note  Fernando Blankenship , a 75 y.o. male  was evaluated in triage.  Pt complains of urinary retention. He has been having Foley catheters in place intermittently over the last week. He had a catheter discontinued in the clinic earlier today and has not been able to void since 2 PM.  Review of Systems  Positive: urinary retention, abdominal pain Negative: fever, vomiting  Physical Exam  BP 123/83 (BP Location: Right Arm)   Pulse (!) 103   Temp 97.8 F (36.6 C) (Oral)   Resp 20   SpO2 97%  Gen:   Awake, uncomfortable appearing   Resp:  Normal effort  MSK:   Moves extremities without difficulty   Medical Decision Making  Medically screening exam initiated at 11:18 PM.  Appropriate orders placed.  Fernando Blankenship was informed that the remainder of the evaluation will be completed by another provider, this initial triage assessment does not replace that evaluation, and the importance of remaining in the ED until their evaluation is complete.     Quintella Reichert, MD 07/07/21 2320

## 2021-07-07 NOTE — ED Triage Notes (Signed)
Pt reports having his foley taken out today by his provider today and has been unable to urinate since 2 pm.

## 2021-07-07 NOTE — ED Notes (Signed)
Labeled specimen cup provided to pt for urine collection per MD order. ENMiles 

## 2021-07-08 LAB — URINALYSIS, ROUTINE W REFLEX MICROSCOPIC
Bacteria, UA: NONE SEEN
Bilirubin Urine: NEGATIVE
Glucose, UA: >=500 mg/dL — AB
Ketones, ur: NEGATIVE mg/dL
Nitrite: POSITIVE — AB
Protein, ur: >=300 mg/dL — AB
RBC / HPF: >50 RBC/hpf — ABNORMAL HIGH (ref 0–5)
Specific Gravity, Urine: 1.022 (ref 1.005–1.030)
pH: 7 (ref 5.0–8.0)
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
## 2021-07-09 LAB — URINE CULTURE: Culture: NO GROWTH

## 2021-08-27 DIAGNOSIS — I82419 Acute embolism and thrombosis of unspecified femoral vein: Secondary | ICD-10-CM

## 2021-08-27 HISTORY — DX: Acute embolism and thrombosis of unspecified femoral vein: I82.419

## 2021-09-05 ENCOUNTER — Encounter (HOSPITAL_COMMUNITY): Payer: Self-pay | Admitting: Emergency Medicine

## 2021-09-05 ENCOUNTER — Emergency Department (HOSPITAL_COMMUNITY): Payer: BC Managed Care – PPO

## 2021-09-05 ENCOUNTER — Inpatient Hospital Stay (HOSPITAL_COMMUNITY)
Admission: EM | Admit: 2021-09-05 | Discharge: 2021-09-12 | DRG: 871 | Disposition: A | Discharge status: Home Health | Payer: BC Managed Care – PPO | Attending: Internal Medicine | Admitting: Internal Medicine

## 2021-09-05 ENCOUNTER — Other Ambulatory Visit: Payer: Self-pay

## 2021-09-05 DIAGNOSIS — I2510 Atherosclerotic heart disease of native coronary artery without angina pectoris: Secondary | ICD-10-CM | POA: Diagnosis present

## 2021-09-05 DIAGNOSIS — Z8249 Family history of ischemic heart disease and other diseases of the circulatory system: Secondary | ICD-10-CM

## 2021-09-05 DIAGNOSIS — Z7982 Long term (current) use of aspirin: Secondary | ICD-10-CM

## 2021-09-05 DIAGNOSIS — G2581 Restless legs syndrome: Secondary | ICD-10-CM | POA: Diagnosis present

## 2021-09-05 DIAGNOSIS — Z20822 Contact with and (suspected) exposure to covid-19: Secondary | ICD-10-CM | POA: Diagnosis present

## 2021-09-05 DIAGNOSIS — M1990 Unspecified osteoarthritis, unspecified site: Secondary | ICD-10-CM | POA: Diagnosis present

## 2021-09-05 DIAGNOSIS — E8729 Other acidosis: Secondary | ICD-10-CM

## 2021-09-05 DIAGNOSIS — Z79899 Other long term (current) drug therapy: Secondary | ICD-10-CM

## 2021-09-05 DIAGNOSIS — N390 Urinary tract infection, site not specified: Secondary | ICD-10-CM

## 2021-09-05 DIAGNOSIS — N400 Enlarged prostate without lower urinary tract symptoms: Secondary | ICD-10-CM | POA: Diagnosis present

## 2021-09-05 DIAGNOSIS — E875 Hyperkalemia: Secondary | ICD-10-CM | POA: Diagnosis present

## 2021-09-05 DIAGNOSIS — J9601 Acute respiratory failure with hypoxia: Secondary | ICD-10-CM | POA: Diagnosis present

## 2021-09-05 DIAGNOSIS — J309 Allergic rhinitis, unspecified: Secondary | ICD-10-CM | POA: Diagnosis present

## 2021-09-05 DIAGNOSIS — R0902 Hypoxemia: Secondary | ICD-10-CM | POA: Diagnosis not present

## 2021-09-05 DIAGNOSIS — K219 Gastro-esophageal reflux disease without esophagitis: Secondary | ICD-10-CM | POA: Diagnosis present

## 2021-09-05 DIAGNOSIS — M6282 Rhabdomyolysis: Secondary | ICD-10-CM

## 2021-09-05 DIAGNOSIS — N179 Acute kidney failure, unspecified: Secondary | ICD-10-CM | POA: Diagnosis present

## 2021-09-05 DIAGNOSIS — I10 Essential (primary) hypertension: Secondary | ICD-10-CM | POA: Diagnosis present

## 2021-09-05 DIAGNOSIS — Z955 Presence of coronary angioplasty implant and graft: Secondary | ICD-10-CM

## 2021-09-05 DIAGNOSIS — T796XXA Traumatic ischemia of muscle, initial encounter: Secondary | ICD-10-CM | POA: Diagnosis present

## 2021-09-05 DIAGNOSIS — Z833 Family history of diabetes mellitus: Secondary | ICD-10-CM

## 2021-09-05 DIAGNOSIS — Z87891 Personal history of nicotine dependence: Secondary | ICD-10-CM

## 2021-09-05 DIAGNOSIS — R710 Precipitous drop in hematocrit: Secondary | ICD-10-CM | POA: Diagnosis present

## 2021-09-05 DIAGNOSIS — E669 Obesity, unspecified: Secondary | ICD-10-CM | POA: Diagnosis present

## 2021-09-05 DIAGNOSIS — Z86010 Personal history of colonic polyps: Secondary | ICD-10-CM

## 2021-09-05 DIAGNOSIS — Z6836 Body mass index (BMI) 36.0-36.9, adult: Secondary | ICD-10-CM

## 2021-09-05 DIAGNOSIS — E785 Hyperlipidemia, unspecified: Secondary | ICD-10-CM | POA: Diagnosis present

## 2021-09-05 DIAGNOSIS — G9341 Metabolic encephalopathy: Secondary | ICD-10-CM | POA: Diagnosis present

## 2021-09-05 DIAGNOSIS — E1140 Type 2 diabetes mellitus with diabetic neuropathy, unspecified: Secondary | ICD-10-CM | POA: Diagnosis present

## 2021-09-05 DIAGNOSIS — R6520 Severe sepsis without septic shock: Secondary | ICD-10-CM | POA: Diagnosis present

## 2021-09-05 DIAGNOSIS — Z888 Allergy status to other drugs, medicaments and biological substances status: Secondary | ICD-10-CM

## 2021-09-05 DIAGNOSIS — H903 Sensorineural hearing loss, bilateral: Secondary | ICD-10-CM | POA: Diagnosis present

## 2021-09-05 DIAGNOSIS — A419 Sepsis, unspecified organism: Secondary | ICD-10-CM | POA: Diagnosis not present

## 2021-09-05 DIAGNOSIS — Z86718 Personal history of other venous thrombosis and embolism: Secondary | ICD-10-CM

## 2021-09-05 DIAGNOSIS — C61 Malignant neoplasm of prostate: Secondary | ICD-10-CM | POA: Diagnosis present

## 2021-09-05 DIAGNOSIS — E872 Acidosis, unspecified: Secondary | ICD-10-CM | POA: Diagnosis present

## 2021-09-05 DIAGNOSIS — R159 Full incontinence of feces: Secondary | ICD-10-CM | POA: Diagnosis present

## 2021-09-05 DIAGNOSIS — Y92009 Unspecified place in unspecified non-institutional (private) residence as the place of occurrence of the external cause: Secondary | ICD-10-CM

## 2021-09-05 DIAGNOSIS — N3090 Cystitis, unspecified without hematuria: Secondary | ICD-10-CM | POA: Diagnosis present

## 2021-09-05 DIAGNOSIS — E876 Hypokalemia: Secondary | ICD-10-CM | POA: Diagnosis not present

## 2021-09-05 DIAGNOSIS — I248 Other forms of acute ischemic heart disease: Secondary | ICD-10-CM | POA: Diagnosis present

## 2021-09-05 DIAGNOSIS — B965 Pseudomonas (aeruginosa) (mallei) (pseudomallei) as the cause of diseases classified elsewhere: Secondary | ICD-10-CM | POA: Diagnosis present

## 2021-09-05 DIAGNOSIS — Z96653 Presence of artificial knee joint, bilateral: Secondary | ICD-10-CM | POA: Diagnosis present

## 2021-09-05 DIAGNOSIS — R4182 Altered mental status, unspecified: Secondary | ICD-10-CM

## 2021-09-05 DIAGNOSIS — E860 Dehydration: Secondary | ICD-10-CM | POA: Diagnosis present

## 2021-09-05 DIAGNOSIS — Z7984 Long term (current) use of oral hypoglycemic drugs: Secondary | ICD-10-CM

## 2021-09-05 LAB — URINALYSIS, MICROSCOPIC (REFLEX)

## 2021-09-05 LAB — RESP PANEL BY RT-PCR (FLU A&B, COVID) ARPGX2
Influenza A by PCR: NEGATIVE
Influenza B by PCR: NEGATIVE
SARS Coronavirus 2 by RT PCR: NEGATIVE

## 2021-09-05 LAB — URINALYSIS, ROUTINE W REFLEX MICROSCOPIC
Bilirubin Urine: NEGATIVE
Glucose, UA: >=500 mg/dL — AB
Ketones, ur: NEGATIVE mg/dL
Nitrite: NEGATIVE
Protein, ur: 100 mg/dL — AB
Specific Gravity, Urine: 1.015 (ref 1.005–1.030)
pH: 6 (ref 5.0–8.0)

## 2021-09-05 LAB — CBC WITH DIFFERENTIAL/PLATELET
Abs Immature Granulocytes: 0.3 10*3/uL — ABNORMAL HIGH (ref 0.00–0.07)
Basophils Absolute: 0 10*3/uL (ref 0.0–0.1)
Basophils Relative: 0 %
Eosinophils Absolute: 0 10*3/uL (ref 0.0–0.5)
Eosinophils Relative: 0 %
HCT: 44.3 % (ref 39.0–52.0)
Hemoglobin: 14.4 g/dL (ref 13.0–17.0)
Immature Granulocytes: 2 %
Lymphocytes Relative: 4 %
Lymphs Abs: 0.7 10*3/uL (ref 0.7–4.0)
MCH: 29.9 pg (ref 26.0–34.0)
MCHC: 32.5 g/dL (ref 30.0–36.0)
MCV: 92.1 fL (ref 80.0–100.0)
Monocytes Absolute: 1 10*3/uL (ref 0.1–1.0)
Monocytes Relative: 6 %
Neutro Abs: 15 10*3/uL — ABNORMAL HIGH (ref 1.7–7.7)
Neutrophils Relative %: 88 %
Platelets: 160 10*3/uL (ref 150–400)
RBC: 4.81 MIL/uL (ref 4.22–5.81)
RDW: 16.2 % — ABNORMAL HIGH (ref 11.5–15.5)
WBC: 17 10*3/uL — ABNORMAL HIGH (ref 4.0–10.5)
nRBC: 0 % (ref 0.0–0.2)

## 2021-09-05 LAB — D-DIMER, QUANTITATIVE: D-Dimer, Quant: 3.71 ug/mL-FEU — ABNORMAL HIGH (ref 0.00–0.50)

## 2021-09-05 LAB — CBG MONITORING, ED: Glucose-Capillary: 294 mg/dL — ABNORMAL HIGH (ref 70–99)

## 2021-09-05 MED ORDER — LACTATED RINGERS IV SOLN: INTRAVENOUS | Status: AC

## 2021-09-05 MED ORDER — METRONIDAZOLE 500 MG/100ML IV SOLN
500.0000 mg | Freq: Once | INTRAVENOUS | Status: AC
  Administered 2021-09-05: 500 mg via INTRAVENOUS
  Filled 2021-09-05: qty 100

## 2021-09-05 MED ORDER — ACETAMINOPHEN 650 MG RE SUPP
650.0000 mg | Freq: Once | RECTAL | Status: AC
  Administered 2021-09-05: 650 mg via RECTAL
  Filled 2021-09-05: qty 1

## 2021-09-05 MED ORDER — VANCOMYCIN HCL IN DEXTROSE 1-5 GM/200ML-% IV SOLN: 1000.0000 mg | Freq: Once | INTRAVENOUS | Status: DC

## 2021-09-05 MED ORDER — LACTATED RINGERS IV BOLUS
1000.0000 mL | Freq: Once | INTRAVENOUS | Status: AC
  Administered 2021-09-05: 1000 mL via INTRAVENOUS

## 2021-09-05 MED ORDER — SODIUM CHLORIDE 0.9 % IV SOLN
2.0000 g | Freq: Once | INTRAVENOUS | Status: AC
  Administered 2021-09-05: 2 g via INTRAVENOUS
  Filled 2021-09-05: qty 2

## 2021-09-05 MED ORDER — VANCOMYCIN HCL 2000 MG/400ML IV SOLN
2000.0000 mg | Freq: Once | INTRAVENOUS | Status: AC
  Administered 2021-09-06: 2000 mg via INTRAVENOUS
  Filled 2021-09-05: qty 400
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
## 2021-09-05 NOTE — ED Notes (Signed)
Fernando Blankenship [phone] would like an update and thinks his dad might have had a stroke
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
## 2021-09-05 NOTE — Progress Notes (Addendum)
Pharmacy Antibiotic Note  Fernando Blankenship is a 75 y.o. male admitted on 09/05/2021 with fever and unknown source of infection  Pharmacy has been consulted for cefepime and vancomycin dosing (currently, plans for 7 days). -WBC= 17, tmax= 103.5 -cefepime 2gm has been given -SCr= 3.43 (baseline ~ 1.0)  Plan: -Cefepime 2gm IV q24h (for 7 days) -Vancomycin 2000mg  IV then will follow SCr trend -Will follow renal function, cultures and clinical progress   Height: 5\' 7"  (170.2 cm) Weight: 104.3 kg (230 lb) IBW/kg (Calculated) : 66.1  Temp (24hrs), Avg:103.5 F (39.7 C), Min:103.5 F (39.7 C), Max:103.5 F (39.7 C)  Recent Labs  Lab 09/05/21 2225  WBC 17.0*    CrCl cannot be calculated (Patient's most recent lab result is older than the maximum 21 days allowed.).    Allergies  Allergen Reactions   Prozac [Fluoxetine] Anaphylaxis   Other Other (See Comments)    Plavix cause his lips to swell     Thank you for allowing pharmacy to be a part of this patient's care.  Hildred Laser, PharmD Clinical Pharmacist **Pharmacist phone directory can now be found on [email or web address] ([email or web address]).  Listed under [place].

## 2021-09-05 NOTE — ED Notes (Signed)
Cleaned patient, brief on, new bed pads

## 2021-09-05 NOTE — ED Notes (Signed)
Daughter Gwynneth Aliment [phone] would like an update

## 2021-09-06 ENCOUNTER — Inpatient Hospital Stay (HOSPITAL_COMMUNITY): Payer: BC Managed Care – PPO

## 2021-09-06 ENCOUNTER — Emergency Department (HOSPITAL_COMMUNITY): Payer: BC Managed Care – PPO

## 2021-09-06 DIAGNOSIS — T796XXA Traumatic ischemia of muscle, initial encounter: Secondary | ICD-10-CM | POA: Diagnosis present

## 2021-09-06 DIAGNOSIS — R55 Syncope and collapse: Secondary | ICD-10-CM

## 2021-09-06 DIAGNOSIS — Z6836 Body mass index (BMI) 36.0-36.9, adult: Secondary | ICD-10-CM | POA: Diagnosis not present

## 2021-09-06 DIAGNOSIS — A4152 Sepsis due to Pseudomonas: Secondary | ICD-10-CM | POA: Diagnosis not present

## 2021-09-06 DIAGNOSIS — N179 Acute kidney failure, unspecified: Secondary | ICD-10-CM

## 2021-09-06 DIAGNOSIS — A419 Sepsis, unspecified organism: Secondary | ICD-10-CM | POA: Diagnosis present

## 2021-09-06 DIAGNOSIS — I248 Other forms of acute ischemic heart disease: Secondary | ICD-10-CM | POA: Diagnosis present

## 2021-09-06 DIAGNOSIS — R609 Edema, unspecified: Secondary | ICD-10-CM | POA: Diagnosis not present

## 2021-09-06 DIAGNOSIS — N390 Urinary tract infection, site not specified: Secondary | ICD-10-CM

## 2021-09-06 DIAGNOSIS — J9601 Acute respiratory failure with hypoxia: Secondary | ICD-10-CM | POA: Diagnosis present

## 2021-09-06 DIAGNOSIS — G9341 Metabolic encephalopathy: Secondary | ICD-10-CM

## 2021-09-06 DIAGNOSIS — E872 Acidosis, unspecified: Secondary | ICD-10-CM | POA: Diagnosis present

## 2021-09-06 DIAGNOSIS — R710 Precipitous drop in hematocrit: Secondary | ICD-10-CM | POA: Diagnosis present

## 2021-09-06 DIAGNOSIS — R4182 Altered mental status, unspecified: Secondary | ICD-10-CM | POA: Diagnosis not present

## 2021-09-06 DIAGNOSIS — R159 Full incontinence of feces: Secondary | ICD-10-CM | POA: Diagnosis present

## 2021-09-06 DIAGNOSIS — E785 Hyperlipidemia, unspecified: Secondary | ICD-10-CM | POA: Diagnosis present

## 2021-09-06 DIAGNOSIS — E669 Obesity, unspecified: Secondary | ICD-10-CM | POA: Diagnosis present

## 2021-09-06 DIAGNOSIS — G2581 Restless legs syndrome: Secondary | ICD-10-CM | POA: Diagnosis present

## 2021-09-06 DIAGNOSIS — I10 Essential (primary) hypertension: Secondary | ICD-10-CM | POA: Diagnosis present

## 2021-09-06 DIAGNOSIS — N3090 Cystitis, unspecified without hematuria: Secondary | ICD-10-CM | POA: Diagnosis present

## 2021-09-06 DIAGNOSIS — E1140 Type 2 diabetes mellitus with diabetic neuropathy, unspecified: Secondary | ICD-10-CM | POA: Diagnosis present

## 2021-09-06 DIAGNOSIS — C61 Malignant neoplasm of prostate: Secondary | ICD-10-CM | POA: Diagnosis present

## 2021-09-06 DIAGNOSIS — R6520 Severe sepsis without septic shock: Secondary | ICD-10-CM | POA: Diagnosis present

## 2021-09-06 DIAGNOSIS — Z20822 Contact with and (suspected) exposure to covid-19: Secondary | ICD-10-CM | POA: Diagnosis present

## 2021-09-06 DIAGNOSIS — I2510 Atherosclerotic heart disease of native coronary artery without angina pectoris: Secondary | ICD-10-CM | POA: Diagnosis present

## 2021-09-06 DIAGNOSIS — R0902 Hypoxemia: Secondary | ICD-10-CM | POA: Diagnosis present

## 2021-09-06 DIAGNOSIS — J309 Allergic rhinitis, unspecified: Secondary | ICD-10-CM | POA: Diagnosis present

## 2021-09-06 DIAGNOSIS — H903 Sensorineural hearing loss, bilateral: Secondary | ICD-10-CM | POA: Diagnosis present

## 2021-09-06 DIAGNOSIS — M6282 Rhabdomyolysis: Secondary | ICD-10-CM

## 2021-09-06 DIAGNOSIS — E8729 Other acidosis: Secondary | ICD-10-CM

## 2021-09-06 DIAGNOSIS — M1990 Unspecified osteoarthritis, unspecified site: Secondary | ICD-10-CM | POA: Diagnosis present

## 2021-09-06 DIAGNOSIS — K219 Gastro-esophageal reflux disease without esophagitis: Secondary | ICD-10-CM | POA: Diagnosis present

## 2021-09-06 DIAGNOSIS — Y92009 Unspecified place in unspecified non-institutional (private) residence as the place of occurrence of the external cause: Secondary | ICD-10-CM | POA: Diagnosis not present

## 2021-09-06 LAB — ECHOCARDIOGRAM COMPLETE
AR max vel: 2.2 cm2
AV Area VTI: 2.38 cm2
AV Area mean vel: 2.31 cm2
AV Mean grad: 4 mmHg
AV Peak grad: 8.1 mmHg
Ao pk vel: 1.42 m/s
Area-P 1/2: 2.5 cm2
Calc EF: 54.1 %
Height: 67 in
S' Lateral: 4.7 cm
Single Plane A2C EF: 51.2 %
Single Plane A4C EF: 55.1 %
Weight: 3680 oz

## 2021-09-06 LAB — I-STAT VENOUS BLOOD GAS, ED
Acid-Base Excess: 0 mmol/L (ref 0.0–2.0)
Bicarbonate: 24.1 mmol/L (ref 20.0–28.0)
Calcium, Ion: 1.08 mmol/L — ABNORMAL LOW (ref 1.15–1.40)
HCT: 37 % — ABNORMAL LOW (ref 39.0–52.0)
Hemoglobin: 12.6 g/dL — ABNORMAL LOW (ref 13.0–17.0)
O2 Saturation: 85 %
Potassium: 3.6 mmol/L (ref 3.5–5.1)
Sodium: 135 mmol/L (ref 135–145)
TCO2: 25 mmol/L (ref 22–32)
pCO2, Ven: 37.5 mmHg — ABNORMAL LOW (ref 44.0–60.0)
pH, Ven: 7.416 (ref 7.250–7.430)
pO2, Ven: 49 mmHg — ABNORMAL HIGH (ref 32.0–45.0)

## 2021-09-06 LAB — LACTIC ACID, PLASMA: Lactic Acid, Venous: 1.9 mmol/L (ref 0.5–1.9)

## 2021-09-06 LAB — COMPREHENSIVE METABOLIC PANEL
ALT: 48 U/L — ABNORMAL HIGH (ref 0–44)
ALT: 50 U/L — ABNORMAL HIGH (ref 0–44)
AST: 164 U/L — ABNORMAL HIGH (ref 15–41)
AST: 147 U/L — ABNORMAL HIGH (ref 15–41)
Albumin: 3.1 g/dL — ABNORMAL LOW (ref 3.5–5.0)
Albumin: 2.5 g/dL — ABNORMAL LOW (ref 3.5–5.0)
Alkaline Phosphatase: 45 U/L (ref 38–126)
Alkaline Phosphatase: 62 U/L (ref 38–126)
Anion gap: 17 — ABNORMAL HIGH (ref 5–15)
Anion gap: 10 (ref 5–15)
BUN: 51 mg/dL — ABNORMAL HIGH (ref 8–23)
BUN: 47 mg/dL — ABNORMAL HIGH (ref 8–23)
CO2: 18 mmol/L — ABNORMAL LOW (ref 22–32)
CO2: 27 mmol/L (ref 22–32)
Calcium: 8.6 mg/dL — ABNORMAL LOW (ref 8.9–10.3)
Calcium: 8.2 mg/dL — ABNORMAL LOW (ref 8.9–10.3)
Chloride: 99 mmol/L (ref 98–111)
Chloride: 97 mmol/L — ABNORMAL LOW (ref 98–111)
Creatinine, Ser: 3.43 mg/dL — ABNORMAL HIGH (ref 0.61–1.24)
Creatinine, Ser: 2.42 mg/dL — ABNORMAL HIGH (ref 0.61–1.24)
GFR, Estimated: 18 mL/min — ABNORMAL LOW (ref >60)
GFR, Estimated: 27 mL/min — ABNORMAL LOW (ref >60)
Glucose, Bld: 285 mg/dL — ABNORMAL HIGH (ref 70–99)
Glucose, Bld: 165 mg/dL — ABNORMAL HIGH (ref 70–99)
Potassium: 5.3 mmol/L — ABNORMAL HIGH (ref 3.5–5.1)
Potassium: 3.4 mmol/L — ABNORMAL LOW (ref 3.5–5.1)
Sodium: 134 mmol/L — ABNORMAL LOW (ref 135–145)
Sodium: 134 mmol/L — ABNORMAL LOW (ref 135–145)
Total Bilirubin: 3.3 mg/dL — ABNORMAL HIGH (ref 0.3–1.2)
Total Bilirubin: 2.2 mg/dL — ABNORMAL HIGH (ref 0.3–1.2)
Total Protein: 6.6 g/dL (ref 6.5–8.1)
Total Protein: 5.7 g/dL — ABNORMAL LOW (ref 6.5–8.1)

## 2021-09-06 LAB — BASIC METABOLIC PANEL
Anion gap: 11 (ref 5–15)
BUN: 47 mg/dL — ABNORMAL HIGH (ref 8–23)
CO2: 24 mmol/L (ref 22–32)
Calcium: 7.9 mg/dL — ABNORMAL LOW (ref 8.9–10.3)
Chloride: 97 mmol/L — ABNORMAL LOW (ref 98–111)
Creatinine, Ser: 3.02 mg/dL — ABNORMAL HIGH (ref 0.61–1.24)
GFR, Estimated: 21 mL/min — ABNORMAL LOW (ref >60)
Glucose, Bld: 228 mg/dL — ABNORMAL HIGH (ref 70–99)
Potassium: 3.9 mmol/L (ref 3.5–5.1)
Sodium: 132 mmol/L — ABNORMAL LOW (ref 135–145)

## 2021-09-06 LAB — CBG MONITORING, ED
Glucose-Capillary: 156 mg/dL — ABNORMAL HIGH (ref 70–99)
Glucose-Capillary: 178 mg/dL — ABNORMAL HIGH (ref 70–99)
Glucose-Capillary: 197 mg/dL — ABNORMAL HIGH (ref 70–99)
Glucose-Capillary: 292 mg/dL — ABNORMAL HIGH (ref 70–99)

## 2021-09-06 LAB — CBC
HCT: 38.5 % — ABNORMAL LOW (ref 39.0–52.0)
Hemoglobin: 12.5 g/dL — ABNORMAL LOW (ref 13.0–17.0)
MCH: 30.3 pg (ref 26.0–34.0)
MCHC: 32.5 g/dL (ref 30.0–36.0)
MCV: 93.2 fL (ref 80.0–100.0)
Platelets: 152 10*3/uL (ref 150–400)
RBC: 4.13 MIL/uL — ABNORMAL LOW (ref 4.22–5.81)
RDW: 16.3 % — ABNORMAL HIGH (ref 11.5–15.5)
WBC: 12.9 10*3/uL — ABNORMAL HIGH (ref 4.0–10.5)
nRBC: 0 % (ref 0.0–0.2)

## 2021-09-06 LAB — GLUCOSE, CAPILLARY: Glucose-Capillary: 182 mg/dL — ABNORMAL HIGH (ref 70–99)

## 2021-09-06 LAB — HEMOGLOBIN A1C
Hgb A1c MFr Bld: 6.6 % — ABNORMAL HIGH (ref 4.8–5.6)
Mean Plasma Glucose: 142.72 mg/dL

## 2021-09-06 LAB — TROPONIN I (HIGH SENSITIVITY)
Troponin I (High Sensitivity): 44 ng/L — ABNORMAL HIGH (ref <18)
Troponin I (High Sensitivity): 46 ng/L — ABNORMAL HIGH (ref <18)

## 2021-09-06 LAB — CK: Total CK: >4100 U/L — ABNORMAL HIGH (ref 49–397)

## 2021-09-06 LAB — HEPARIN LEVEL (UNFRACTIONATED): Heparin Unfractionated: 0.18 IU/mL — ABNORMAL LOW (ref 0.30–0.70)

## 2021-09-06 LAB — BRAIN NATRIURETIC PEPTIDE: B Natriuretic Peptide: 104.2 pg/mL — ABNORMAL HIGH (ref 0.0–100.0)

## 2021-09-06 MED ORDER — PANTOPRAZOLE SODIUM 40 MG PO TBEC
40.0000 mg | DELAYED_RELEASE_TABLET | Freq: Every day | ORAL | Status: DC
  Administered 2021-09-06 – 2021-09-12 (×7): 40 mg via ORAL
  Filled 2021-09-06 (×7): qty 1

## 2021-09-06 MED ORDER — HEPARIN BOLUS VIA INFUSION
2000.0000 [IU] | Freq: Once | INTRAVENOUS | Status: AC
  Administered 2021-09-06: 2000 [IU] via INTRAVENOUS
  Filled 2021-09-06: qty 2000

## 2021-09-06 MED ORDER — VANCOMYCIN VARIABLE DOSE PER UNSTABLE RENAL FUNCTION (PHARMACIST DOSING): Status: DC

## 2021-09-06 MED ORDER — HEPARIN (PORCINE) 25000 UT/250ML-% IV SOLN
1800.0000 [IU]/h | INTRAVENOUS | Status: AC
  Administered 2021-09-06: 04:00:00 1400 [IU]/h via INTRAVENOUS
  Administered 2021-09-07: 1700 [IU]/h via INTRAVENOUS
  Administered 2021-09-08 – 2021-09-09 (×3): 1800 [IU]/h via INTRAVENOUS
  Filled 2021-09-06 (×7): qty 250

## 2021-09-06 MED ORDER — HEPARIN BOLUS VIA INFUSION
5000.0000 [IU] | Freq: Once | INTRAVENOUS | Status: AC
  Administered 2021-09-06: 5000 [IU] via INTRAVENOUS
  Filled 2021-09-06: qty 5000

## 2021-09-06 MED ORDER — SODIUM CHLORIDE 0.9 % IV SOLN
2.0000 g | INTRAVENOUS | Status: DC
  Administered 2021-09-07 – 2021-09-08 (×2): 2 g via INTRAVENOUS
  Filled 2021-09-06 (×2): qty 2

## 2021-09-06 MED ORDER — STERILE WATER FOR INJECTION IV SOLN
INTRAVENOUS | Status: DC
  Filled 2021-09-06: qty 1000

## 2021-09-06 MED ORDER — ACETAMINOPHEN 325 MG PO TABS
650.0000 mg | ORAL_TABLET | Freq: Four times a day (QID) | ORAL | Status: DC | PRN
  Administered 2021-09-06 (×2): 650 mg via ORAL
  Filled 2021-09-06 (×2): qty 2

## 2021-09-06 MED ORDER — LACTATED RINGERS IV BOLUS
1000.0000 mL | Freq: Once | INTRAVENOUS | Status: AC
  Administered 2021-09-06: 1000 mL via INTRAVENOUS

## 2021-09-06 MED ORDER — ROPINIROLE HCL 1 MG PO TABS
1.0000 mg | ORAL_TABLET | Freq: Every day | ORAL | Status: DC
  Administered 2021-09-06 – 2021-09-11 (×6): 1 mg via ORAL
  Filled 2021-09-06 (×6): qty 1

## 2021-09-06 MED ORDER — TECHNETIUM TO 99M ALBUMIN AGGREGATED: 4.1000 | Freq: Once | INTRAVENOUS | Status: DC | PRN

## 2021-09-06 MED ORDER — ACETAMINOPHEN 650 MG RE SUPP: 650.0000 mg | Freq: Four times a day (QID) | RECTAL | Status: DC | PRN

## 2021-09-06 MED ORDER — INSULIN ASPART 100 UNIT/ML IJ SOLN
0.0000 [IU] | Freq: Three times a day (TID) | INTRAMUSCULAR | Status: DC
  Administered 2021-09-06 (×3): 2 [IU] via SUBCUTANEOUS
  Administered 2021-09-07: 3 [IU] via SUBCUTANEOUS
  Administered 2021-09-07 (×2): 2 [IU] via SUBCUTANEOUS
  Administered 2021-09-08: 3 [IU] via SUBCUTANEOUS
  Administered 2021-09-08 (×2): 2 [IU] via SUBCUTANEOUS
  Administered 2021-09-09: 1 [IU] via SUBCUTANEOUS
  Administered 2021-09-09 – 2021-09-12 (×9): 2 [IU] via SUBCUTANEOUS
  Administered 2021-09-12: 3 [IU] via SUBCUTANEOUS

## 2021-09-06 MED ORDER — INSULIN ASPART 100 UNIT/ML IJ SOLN
0.0000 [IU] | Freq: Every day | INTRAMUSCULAR | Status: DC
  Administered 2021-09-08 – 2021-09-10 (×2): 2 [IU] via SUBCUTANEOUS
  Administered 2021-09-11: 3 [IU] via SUBCUTANEOUS

## 2021-09-06 NOTE — ED Notes (Signed)
Daughter updated regarding pts status. Pt also spoke with daughter
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
## 2021-09-06 NOTE — ED Provider Notes (Addendum)
Care assumed from Dr. Pearline Cables.  Patient brought in by EMS after being found down at home not seen for several days.  Found to be hypoxic, delirious, incontinent of urine and feces  He is being treated for sepsis possibly due to UTI.  He received IV fluids and broad-spectrum antibiotics.  Labs are pending.  Does have elevated D-dimer and tachycardia.  Creatinine elevated, unable to receive IV contrast.  Patient will obtain noncontrast CT scan does not have any right heart strain on bedside echo.  Patient found to have AKI with bicarb low in the 15 range.  CK greater than 4000.  IV fluids and IV bicarb will be initiated  D/w Dr. Carolin Sicks with nephrology.  He agrees with additional IV fluid resuscitation and bicarb drip.  Traumatic imaging were remarkable for cystitis.  Also nonspecific fluid collection left pericolic gutter suspicious for hemorrhage.  Patient does not recall any traumatic injury or fall.  He is not anticoagulated.  Discussed with trauma surgery who will evaluate Agrees with medical admission  With Negative head CT, will start empiric heparin while pulmonary embolism is ruled out.  No findings on chest x-ray to explain his hypoxia  Airway, blood pressure, and mental status remained stable.  Remains tachycardic in the 110s.  Continue treatment for sepsis.  Patient has been seen by trauma surgery who will monitor his abdomen with serial exams at this point. It is not felt that the fluid in his abdomen is representative of hemorrhage.   Admission discussed with Dr. Marlowe Sax.   .Critical Care Performed by: Ezequiel Essex, MD Authorized by: Ezequiel Essex, MD   Critical care provider statement:    Critical care time (minutes):  60   Critical care time was exclusive of:  Separately billable procedures and treating other patients   Critical care was necessary to treat or prevent imminent or life-threatening deterioration of the following conditions:  Trauma, dehydration, sepsis  and metabolic crisis   Critical care was time spent personally by me on the following activities:  Development of treatment plan with patient or surrogate, discussions with consultants, evaluation of patient's response to treatment, examination of patient, ordering and review of laboratory studies, ordering and review of radiographic studies, ordering and performing treatments and interventions, pulse oximetry, re-evaluation of patient's condition and review of old charts   I assumed direction of critical care for this patient from another provider in my specialty: yes     Care discussed with: admitting provider      Ezequiel Essex, MD 09/06/21 [id number]    Ezequiel Essex, MD 09/06/21 0930

## 2021-09-06 NOTE — Sepsis Progress Note (Signed)
Following for sepsis monitoring ?

## 2021-09-06 NOTE — ED Notes (Signed)
Patient linen changed with new primofit placed.
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
## 2021-09-06 NOTE — Progress Notes (Signed)
2D echocardiogram completed.  09/06/2021 11:42 AM Kelby Aline., MHA, RVT, RDCS, RDMS

## 2021-09-06 NOTE — Progress Notes (Signed)
Bilateral lower extremity venous duplex and carotid artery duplex completed. Refer to "CV Proc" under chart review to view preliminary results.  09/06/2021 11:46 AM Kelby Aline., MHA, RVT, RDCS, RDMS

## 2021-09-06 NOTE — Progress Notes (Signed)
PROGRESS NOTE    Fernando Blankenship  EXB:[id number] DOB: 1945/12/08 DOA: 09/05/2021 PCP: Lavone Orn, MD    Chief Complaint  Patient presents with   Altered Mental Status    Brief Narrative:   Fernando Blankenship is a 75 y.o. male with medical history significant of CAD status post PCI in 2014, hypertension, hyperlipidemia, GERD, non-insulin-dependent type 2 diabetes, prostate cancer status post radioactive seed implant in August 2022, RLS, BPH presented to the ED via EMS for evaluation of altered mental status  per daughter patient lives by himself ,was last seen 2-3 days ago, was not answering calls. EMS was called and had to break into residence to assess patient, he was down on the ground, incontinent of urine and feces. Delirious, hypoxic on room air. He was placed on [place] by EMS with improvement to respiratory status  Traumatic imaging were remarkable for cystitis.  Also nonspecific fluid collection left pericolic gutter suspicious for hemorrhage.  Patient does not recall any traumatic injury or fall.  He is not anticoagulated.EDP consulted trauma surgery   With Negative head CT, will start empiric heparin while pulmonary embolism is ruled out.  No findings on chest x-ray to explain his hypoxia  ED physician did bedside echo which did not show right heart strain. COVID and influenza PCR negative   Subjective:  Patient is seen after returned from V/Q scan, he currently denies acute pain States he has been having dysuria /blood in the urine since prostate surgery, he states he does self cath 10times a day at home Per granddaughter, patient does not appear to have significant confusion this am , though he does appear to be a poor historian   Assessment & Plan:   Principal Problem:   Sepsis ([place]) Active Problems:   UTI (urinary tract infection)   Rhabdomyolysis   Acute kidney injury ([place])   High anion gap metabolic acidosis   Severe sepsis with complicated UTI  /acute metabolic  encephalopathy present on admission -ct head no acute findings -report Does intermittent self-catheterization at home since after the prostate cancer treatment prostate cancer -per chart review patient has multiple ED visit for urinary retention in october -he is started on broad spectrum abx, blood culture no growth so far, will get mrsa screening, plan to d/c vanc if mrsa screening is negative F/u on urine culture   Acute hypoxic respiratory failure  Oxygen saturation in the high 80s on room air, improved with supplemental oxygen He is empirically started on heparin drip until PE ruled out, per family patient has history of DVT venous Doppler + for acute DVT right peroneal vein , -f/u on  VQ scan and echocardiogram ordered on admission  Rhabdomyolysis Continue hydration, trend ck  Fluids in left paracolic gutter CT showing mild complex fluid along the left pericolic gutter concerning for hemorrhage.  Initial hemoglobin 14.4, repeat 12.6 after fluid boluses.  General surgery did not feel that the free fluid in the left pericolic gutter was due to hemorrhage.  Drop in hemoglobin after fluid boluses felt to be due to hemodilution.  Abdominal exam currently benign. -Monitor H&H as patient was started on IV heparin due to concern for possible PE.  Hold home aspirin.  Serial abdominal exams.  AKI/metabolic acidosis/mild hyperkalemia on presentation Received bicarb infusion, case discussed with nephrology who recommend no need of formal nephrology consult, recommend  d/c bicarb drip now that bicarb above 20, k normalized,  Cr slightly improved, monitor cr, renal dosing meds CT ab/pel, no  hydro, + cystitis   Transaminitis Likely from rhabdomyolysis and sepsis Trend  History of CAD status post PCI in 2014 Mild elevated high-sensitivity troponin 46-44, not consistent with ACS  Found down at home Unclear detail Getting echocardiogram, EEG, carotid  Dopplers  Hypertension/hyperlipidemia Hold BP meds due to sepsis Hold statin due to transaminitis  Noninsulin-dependent type 2 diabetes Hold home medications metformin and Jardiance Check A1c On SSI now  Prostate cancer status post radioactive seed implant in August 2022/BPH/chronic urinary retention Does intermittent self-catheterization at home since after the prostate cancer treatment  Getting bladder scan here Continue home medication Flomax, oxybutynin, Solifenacin  RLS Continue Requip  GERD Continue Protonix   Body mass index is 36.02 kg/m.Marland Kitchen      Unresulted Labs (From admission, onward)     Start     Ordered   09/07/21 0500  Heparin level (unfractionated)  Daily,   R      09/06/21 0354   09/07/21 0500  CBC  Daily,   R      09/06/21 0354   09/07/21 0500  Comprehensive metabolic panel  Tomorrow morning,   R        09/06/21 0834   09/06/21 1300  Heparin level (unfractionated)  Once-Timed,   TIMED        09/06/21 0354   09/06/21 0745  CBC  Once,   R        09/06/21 0745   09/06/21 0745  Comprehensive metabolic panel  Once,   R        09/06/21 0745   09/06/21 0745  CK  Once,   R        09/06/21 0745   09/06/21 0740  Hemoglobin A1c  Once,   R       Comments: To assess prior glycemic control    09/06/21 0745   09/05/21 2342  Urine Culture  Once,   STAT       Question:  Indication  Answer:  Sepsis   09/05/21 2342   09/05/21 2226  Blood culture (routine x 2)  BLOOD CULTURE X 2,   STAT      09/05/21 2226   09/05/21 2225  Lactic acid, plasma  Now then every 2 hours,   STAT      09/05/21 2226              DVT prophylaxis: on heparin drip   Code Status:full Family Communication: granddaughter at bedside  Disposition:   Status is: Inpatient  Dispo: The patient is from: home, lives by himself , still works for the [employer]               Anticipated d/c is to: need PT eval once medically stable               Anticipated d/c date is: >48hrs, TBD                 Consultants:  General surgery  Procedures:  V/Q scan  Antimicrobials:   Anti-infectives (From admission, onward)    Start     Dose/Rate Route Frequency Ordered Stop   09/07/21 2200  ceFEPIme (MAXIPIME) 2 g in sodium chloride 0.9 % 100 mL IVPB        2 g 200 mL/hr over 30 Minutes Intravenous Every 24 hours 09/06/21 0104 09/14/21 2159   09/05/21 2330  vancomycin (VANCOREADY) IVPB 2000 mg/400 mL        2,000 mg 200 mL/hr over 120 Minutes Intravenous  Once 09/05/21 2318 09/06/21 0227   09/05/21 2315  ceFEPIme (MAXIPIME) 2 g in sodium chloride 0.9 % 100 mL IVPB        2 g 200 mL/hr over 30 Minutes Intravenous  Once 09/05/21 2312 09/06/21 0004   09/05/21 2315  metroNIDAZOLE (FLAGYL) IVPB 500 mg        500 mg 100 mL/hr over 60 Minutes Intravenous  Once 09/05/21 2312 09/06/21 0023   09/05/21 2315  vancomycin (VANCOCIN) IVPB 1000 mg/200 mL premix  Status:  Discontinued        1,000 mg 200 mL/hr over 60 Minutes Intravenous  Once 09/05/21 2312 09/05/21 2318           Objective: Vitals:   09/06/21 0645 09/06/21 0715 09/06/21 0800 09/06/21 0815  BP: 124/69 (!) 122/102 110/85 108/77  Pulse: (!) 114 (!) 113 (!) 111 (!) 111  Resp: 16 18 (!) 25 (!) 25  Temp:  (!) 101.6 F (38.7 C)    TempSrc:  Rectal    SpO2: 100% 96% 95% 94%  Weight:      Height:        Intake/Output Summary (Last 24 hours) at 09/06/2021 0836 Last data filed at 09/06/2021 0646 Gross per 24 hour  Intake 3600.82 ml  Output --  Net 3600.82 ml   Filed Weights   09/05/21 2248  Weight: 104.3 kg    Examination:  General exam: alert, awake, communicative, aaox3 Respiratory system: Clear to auscultation. Respiratory effort normal. Cardiovascular system:  RRR.  Gastrointestinal system: Abdomen is nondistended, soft and nontender.  Normal bowel sounds heard. Central nervous system: Alert and oriented. No focal neurological deficits. Extremities:  no edema Skin: No rashes, lesions or  ulcers Psychiatry: calm and cooperative .     Data Reviewed: I have personally reviewed following labs and imaging studies  CBC: Recent Labs  Lab 09/05/21 2225 09/06/21 0239  WBC 17.0*  --   NEUTROABS 15.0*  --   HGB 14.4 12.6*  HCT 44.3 37.0*  MCV 92.1  --   PLT 160  --     Basic Metabolic Panel: Recent Labs  Lab 09/05/21 2225 09/06/21 0239 09/06/21 0349  NA 134* 135 132*  K 5.3* 3.6 3.9  CL 99  --  97*  CO2 18*  --  24  GLUCOSE 285*  --  228*  BUN 51*  --  47*  CREATININE 3.43*  --  3.02*  CALCIUM 8.6*  --  7.9*    GFR: Estimated Creatinine Clearance: 24.3 mL/min (A) (by C-G formula based on SCr of 3.02 mg/dL (H)).  Liver Function Tests: Recent Labs  Lab 09/05/21 2225  AST 164*  ALT 48*  ALKPHOS 45  BILITOT 3.3*  PROT 6.6  ALBUMIN 3.1*    CBG: Recent Labs  Lab 09/05/21 2243 09/06/21 0030 09/06/21 0823  GLUCAP 294* 292* 178*     Recent Results (from the past 240 hour(s))  Resp Panel by RT-PCR (Flu A&B, Covid)     Status: None   Collection Time: 09/05/21 11:13 PM   Specimen: Nasopharyngeal(NP) swabs in vial transport medium  Result Value Ref Range Status   SARS Coronavirus 2 by RT PCR NEGATIVE NEGATIVE Final    Comment: (NOTE) SARS-CoV-2 target nucleic acids are NOT DETECTED.  The SARS-CoV-2 RNA is generally detectable in upper respiratory specimens during the acute phase of infection. The lowest concentration of SARS-CoV-2 viral copies this assay can detect is 138 copies/mL. A negative result does not preclude SARS-Cov-2 infection  and should not be used as the sole basis for treatment or other patient management decisions. A negative result may occur with  improper specimen collection/handling, submission of specimen other than nasopharyngeal swab, presence of viral mutation(s) within the areas targeted by this assay, and inadequate number of viral copies(<138 copies/mL). A negative result must be combined with clinical observations,  patient history, and epidemiological information. The expected result is Negative.  Fact Sheet for Patients:  EntrepreneurPulse.com.au  Fact Sheet for Healthcare Providers:  IncredibleEmployment.be  This test is no t yet approved or cleared by the [place] FDA and  has been authorized for detection and/or diagnosis of SARS-CoV-2 by FDA under an Emergency Use Authorization (EUA). This EUA will remain  in effect (meaning this test can be used) for the duration of the COVID-19 declaration under Section 564(b)(1) of the Act, 21 U.S.C.section 360bbb-3(b)(1), unless the authorization is terminated  or revoked sooner.       Influenza A by PCR NEGATIVE NEGATIVE Final   Influenza B by PCR NEGATIVE NEGATIVE Final    Comment: (NOTE) The Xpert Xpress SARS-CoV-2/FLU/RSV plus assay is intended as an aid in the diagnosis of influenza from Nasopharyngeal swab specimens and should not be used as a sole basis for treatment. Nasal washings and aspirates are unacceptable for Xpert Xpress SARS-CoV-2/FLU/RSV testing.  Fact Sheet for Patients: EntrepreneurPulse.com.au  Fact Sheet for Healthcare Providers: IncredibleEmployment.be  This test is not yet approved or cleared by the [place] FDA and has been authorized for detection and/or diagnosis of SARS-CoV-2 by FDA under an Emergency Use Authorization (EUA). This EUA will remain in effect (meaning this test can be used) for the duration of the COVID-19 declaration under Section 564(b)(1) of the Act, 21 U.S.C. section 360bbb-3(b)(1), unless the authorization is terminated or revoked.  Performed at [facility], [place] [address] [place], [place] [address]          Radiology Studies: CT HEAD WO CONTRAST (5MM)  Result Date: 09/05/2021 CLINICAL DATA:  Delirium EXAM: CT HEAD WITHOUT CONTRAST TECHNIQUE: Contiguous axial images were obtained from the base of  the skull through the vertex without intravenous contrast. COMPARISON:  06/23/2004 FINDINGS: Brain: No evidence of acute infarction, hemorrhage, hydrocephalus, extra-axial collection or mass lesion/mass effect. Mild subcortical white matter and periventricular small vessel ischemic changes. Vascular: No hyperdense vessel or unexpected calcification. Skull: Normal. Negative for fracture or focal lesion. Sinuses/Orbits: The visualized paranasal sinuses are essentially clear. The mastoid air cells are unopacified. Other: None. IMPRESSION: No evidence of acute intracranial abnormality. Mild small vessel ischemic changes. Electronically Signed   By: Julian Hy M.D.   On: 09/05/2021 23:38   DG Chest Portable 1 View  Result Date: 09/05/2021 CLINICAL DATA:  Altered level of consciousness, history of prostate cancer EXAM: PORTABLE CHEST 1 VIEW COMPARISON:  02/12/2021 FINDINGS: Single frontal view of the chest demonstrates an unremarkable cardiac silhouette. No acute airspace disease, effusion, or pneumothorax. No acute bony abnormalities. IMPRESSION: 1. No acute intrathoracic process. Electronically Signed   By: Randa Ngo M.D.   On: 09/05/2021 23:00   CT CHEST ABDOMEN PELVIS WO CONTRAST  Result Date: 09/06/2021 CLINICAL DATA:  Found down, altered mental status, abdominal pain EXAM: CT CHEST, ABDOMEN AND PELVIS WITHOUT CONTRAST TECHNIQUE: Multidetector CT imaging of the chest, abdomen and pelvis was performed following the standard protocol without IV contrast. COMPARISON:  Chest radiograph dated 09/05/2021 FINDINGS: CT CHEST FINDINGS Cardiovascular: The heart is normal in size. No pericardial effusion. No evidence of  thoracic aortic aneurysm. Atherosclerotic calcifications of the arch. Coronary atherosclerosis of the LAD. Mediastinum/Nodes: No suspicious mediastinal lymphadenopathy. Visualized thyroid is unremarkable. Lungs/Pleura: Mild paraseptal emphysematous changes, upper lung predominant. No  suspicious pulmonary nodules. Mild dependent atelectasis in the bilateral lower lobes. No focal consolidation. No pleural effusion or pneumothorax. Musculoskeletal: Degenerative changes of the thoracic spine. CT ABDOMEN PELVIS FINDINGS Hepatobiliary: Unenhanced liver is unremarkable, noting focal fat along the gallbladder fossa. Gallbladder is unremarkable. No intrahepatic or extrahepatic ductal dilatation. Pancreas: Within normal limits. Spleen: Within normal limits. Adrenals/Urinary Tract: Mild nodular thickening of the right adrenal gland. 14 mm left adrenal nodule (series 3/image 63), indeterminate but likely reflecting a benign adrenal adenoma. Kidneys are within normal limits, noting nonspecific perinephric stranding. No renal, ureteral, or bladder calculi. No hydronephrosis. Thick-walled bladder, correlate for cystitis. Stomach/Bowel: Stomach is within normal limits. No evidence of bowel obstruction. Normal appendix (series 3/image 95). Left colonic diverticulosis, without evidence of diverticulitis. Vascular/Lymphatic: No evidence of abdominal aortic aneurysm. Atherosclerotic calcifications of the abdominal aorta and branch vessels. No suspicious abdominopelvic lymphadenopathy. Reproductive: Brachytherapy seeds along the prostate. Other: Mild complex fluid/hemorrhage along the left pericolic gutter (series 3/image 79). Musculoskeletal: Mild degenerative changes of the lumbar spine. IMPRESSION: Thick-walled bladder, correlate for cystitis. Brachytherapy seeds along the prostate. No findings suspicious for metastatic disease. Mild complex fluid/hemorrhage along the left pericolic gutter, nonspecific. No acute cardiopulmonary abnormality. Electronically Signed   By: Julian Hy M.D.   On: 09/06/2021 02:47        Scheduled Meds:  insulin aspart  0-5 Units Subcutaneous QHS   insulin aspart  0-9 Units Subcutaneous TID WC   pantoprazole  40 mg Oral Daily   rOPINIRole  1 mg Oral QHS   Continuous  Infusions:  [START ON 09/07/2021] ceFEPime (MAXIPIME) IV     heparin 1,400 Units/hr (09/06/21 0405)   lactated ringers 150 mL/hr at 09/06/21 0017    sodium bicarbonate (isotonic) infusion in sterile water 75 mL/hr at 09/06/21 0226     LOS: 0 days   Time spent: 72mins Greater than 50% of this time was spent in counseling, explanation of diagnosis, planning of further management, and coordination of care.   Voice Recognition Viviann Spare dictation system was used to create this note, attempts have been made to correct errors. Please contact the author with questions and/or clarifications.   Florencia Reasons, MD PhD FACP [facility]  Available via Epic secure chat 7am-7pm for nonurgent issues Please page for urgent issues To page the attending provider between 7A-7P or the covering provider during after hours 7P-7A, please log into the web site [email or web address] and access using universal [place] password for that web site. If you do not have the password, please call the hospital operator.    09/06/2021, 8:36 AM

## 2021-09-06 NOTE — Progress Notes (Signed)
ANTICOAGULATION CONSULT NOTE - Follow Up Consult  Pharmacy Consult for heparin  Indication: DVT  Allergies  Allergen Reactions   Prozac [Fluoxetine] Anaphylaxis   Other Other (See Comments)    Plavix cause his lips to swell    Patient Measurements: Height: 5\' 7"  (170.2 cm) Weight: 104.3 kg (230 lb) IBW/kg (Calculated) : 66.1 HEPARIN DW (KG): 89.1   Vital Signs: Temp: 98.7 F (37.1 C) (12/11 1011) Temp Source: Rectal (12/11 0715) BP: 140/73 (12/11 1530) Pulse Rate: 115 (12/11 1530)  Labs: Recent Labs    09/05/21 2225 09/06/21 0025 09/06/21 0239 09/06/21 0349 09/06/21 1441  HGB 14.4  --  12.6*  --  12.5*  HCT 44.3  --  37.0*  --  38.5*  PLT 160  --   --   --  152  HEPARINUNFRC  --   --   --   --  0.18*  CREATININE 3.43*  --   --  3.02* 2.42*  CKTOTAL >4,100*  --   --   --  5,530*  TROPONINIHS 46* 44*  --   --   --      Estimated Creatinine Clearance: 30.4 mL/min (A) (by C-G formula based on SCr of 2.42 mg/dL (H)).   Medical History: Past Medical History:  Diagnosis Date   Anginal pain ([place])    "just this week" (09/29/2012) none since 2014 per pt on 04-27-2021   Arthritis    "hands, feet" (09/29/2012)   Bladder incontinence 04/27/2021   wears pads   Coronary artery disease    Diabetic neuropathy ([place]) 04/27/2021   both feet   dm type 2    does not check cbg per pt on 81-09-2020   GERD (gastroesophageal reflux disease) 04/27/2021   H/O hiatal hernia    Hypertension 04/27/2021   Prostate cancer ([place]) 04/27/2021   Restless leg syndrome    Varicose vein of leg    post op dvt 09-2010   Wears glasses 04/27/2021    insulin aspart  0-5 Units Subcutaneous QHS   insulin aspart  0-9 Units Subcutaneous TID WC   pantoprazole  40 mg Oral Daily   rOPINIRole  1 mg Oral QHS    Assessment: 75 yo male to start heparin for DVT and r/o PE. A VQ scan today ruled out PE.   Initial heparin level is subtherapeutic at 0.18. No s/s of bleeding reported by RN   H/H low, Plt  wnl. SCr 2.42  Goal of Therapy:  Heparin level 0.3-0.7 units/ml Monitor platelets by anticoagulation protocol: Yes   Plan:  -Heparin 2000 units IV bolus, then increase infusion to 1700 units/hr  -Heparin level in 8 hours and daily wth CBC daily  Albertina Parr, PharmD., BCPS, BCCCP Clinical Pharmacist Please refer to [facility] for unit-specific pharmacist

## 2021-09-06 NOTE — Discharge Instructions (Addendum)
Information on my medicine - ELIQUIS (apixaban)  This medication education was reviewed with me or my healthcare representative as part of my discharge preparation.   Why was Eliquis prescribed for you? Eliquis was prescribed to treat blood clots that may have been found in the veins of your legs (deep vein thrombosis) or in your lungs (pulmonary embolism) and to reduce the risk of them occurring again.  What do You need to know about Eliquis ? The starting dose is 10 mg (two 5 mg tablets) taken TWICE daily for the FIRST SEVEN (7) DAYS, then on 09/16/21  the dose is reduced to ONE 5 mg tablet taken TWICE daily.  Eliquis may be taken with or without food.   Try to take the dose about the same time in the morning and in the evening. If you have difficulty swallowing the tablet whole please discuss with your pharmacist how to take the medication safely.  Take Eliquis exactly as prescribed and DO NOT stop taking Eliquis without talking to the doctor who prescribed the medication.  Stopping may increase your risk of developing a new blood clot.  Refill your prescription before you run out.  After discharge, you should have regular check-up appointments with your healthcare provider that is prescribing your Eliquis.    What do you do if you miss a dose? If a dose of ELIQUIS is not taken at the scheduled time, take it as soon as possible on the same day and twice-daily administration should be resumed. The dose should not be doubled to make up for a missed dose.  Important Safety Information A possible side effect of Eliquis is bleeding. You should call your healthcare provider right away if you experience any of the following: Bleeding from an injury or your nose that does not stop. Unusual colored urine (red or dark brown) or unusual colored stools (red or black). Unusual bruising for unknown reasons. A serious fall or if you hit your head (even if there is no bleeding).  Some  medicines may interact with Eliquis and might increase your risk of bleeding or clotting while on Eliquis. To help avoid this, consult your healthcare provider or pharmacist prior to using any new prescription or non-prescription medications, including herbals, vitamins, non-steroidal anti-inflammatory drugs (NSAIDs) and supplements.  This website has more information on Eliquis (apixaban): http://www.eliquis.com/eliquis/home

## 2021-09-06 NOTE — Progress Notes (Signed)
ANTICOAGULATION CONSULT NOTE - Initial Consult  Pharmacy Consult for heparin  Indication: pulmonary embolus  Allergies  Allergen Reactions   Prozac [Fluoxetine] Anaphylaxis   Other Other (See Comments)    Plavix cause his lips to swell    Patient Measurements: Height: 5\' 7"  (170.2 cm) Weight: 104.3 kg (230 lb) IBW/kg (Calculated) : 66.1 HEPARIN DW (KG): 89.1   Vital Signs: Temp: 103.5 F (39.7 C) (12/10 2239) Temp Source: Rectal (12/10 2239) BP: 123/83 (12/11 0330) Pulse Rate: 108 (12/11 0330)  Labs: Recent Labs    09/05/21 2225 09/06/21 0025 09/06/21 0239  HGB 14.4  --  12.6*  HCT 44.3  --  37.0*  PLT 160  --   --   CREATININE 3.43*  --   --   CKTOTAL >4,100*  --   --   TROPONINIHS 46* 44*  --     Estimated Creatinine Clearance: 21.4 mL/min (A) (by C-G formula based on SCr of 3.43 mg/dL (H)).   Medical History: Past Medical History:  Diagnosis Date   Anginal pain ([place])    "just this week" (09/29/2012) none since 2014 per pt on 04-27-2021   Arthritis    "hands, feet" (09/29/2012)   Bladder incontinence 04/27/2021   wears pads   Coronary artery disease    Diabetic neuropathy ([place]) 04/27/2021   both feet   dm type 2    does not check cbg per pt on 81-09-2020   GERD (gastroesophageal reflux disease) 04/27/2021   H/O hiatal hernia    Hypertension 04/27/2021   Prostate cancer ([place]) 04/27/2021   Restless leg syndrome    Varicose vein of leg    post op dvt 09-2010   Wears glasses 04/27/2021     Assessment: 75 yo male to start heparin for r/o PE.  No anticoagulants noted PTA.  -hg= 12.6  Goal of Therapy:  Heparin level 0.3-0.7 units/ml Monitor platelets by anticoagulation protocol: Yes   Plan:  -heparin 5000 units IV bolus followed by 1400 units/hr -Heparin level in 8 hours and daily wth CBC daily  Hildred Laser, PharmD Clinical Pharmacist **Pharmacist phone directory can now be found on [email or web address] ([email or web address]).  Listed under [place].

## 2021-09-07 ENCOUNTER — Other Ambulatory Visit: Payer: Self-pay

## 2021-09-07 ENCOUNTER — Inpatient Hospital Stay (HOSPITAL_COMMUNITY)
Admit: 2021-09-07 | Discharge: 2021-09-07 | Disposition: A | Discharge status: Home / Self Care | Payer: BC Managed Care – PPO | Attending: Internal Medicine | Admitting: Internal Medicine

## 2021-09-07 DIAGNOSIS — A419 Sepsis, unspecified organism: Secondary | ICD-10-CM | POA: Diagnosis not present

## 2021-09-07 DIAGNOSIS — R4182 Altered mental status, unspecified: Secondary | ICD-10-CM

## 2021-09-07 LAB — GLUCOSE, CAPILLARY
Glucose-Capillary: 160 mg/dL — ABNORMAL HIGH (ref 70–99)
Glucose-Capillary: 237 mg/dL — ABNORMAL HIGH (ref 70–99)
Glucose-Capillary: 172 mg/dL — ABNORMAL HIGH (ref 70–99)
Glucose-Capillary: 197 mg/dL — ABNORMAL HIGH (ref 70–99)

## 2021-09-07 LAB — COMPREHENSIVE METABOLIC PANEL
ALT: 47 U/L — ABNORMAL HIGH (ref 0–44)
AST: 101 U/L — ABNORMAL HIGH (ref 15–41)
Albumin: 2.3 g/dL — ABNORMAL LOW (ref 3.5–5.0)
Alkaline Phosphatase: 39 U/L (ref 38–126)
Anion gap: 10 (ref 5–15)
BUN: 46 mg/dL — ABNORMAL HIGH (ref 8–23)
CO2: 26 mmol/L (ref 22–32)
Calcium: 7.9 mg/dL — ABNORMAL LOW (ref 8.9–10.3)
Chloride: 98 mmol/L (ref 98–111)
Creatinine, Ser: 2.17 mg/dL — ABNORMAL HIGH (ref 0.61–1.24)
GFR, Estimated: 31 mL/min — ABNORMAL LOW (ref >60)
Glucose, Bld: 184 mg/dL — ABNORMAL HIGH (ref 70–99)
Potassium: 3.3 mmol/L — ABNORMAL LOW (ref 3.5–5.1)
Sodium: 134 mmol/L — ABNORMAL LOW (ref 135–145)
Total Bilirubin: 1.5 mg/dL — ABNORMAL HIGH (ref 0.3–1.2)
Total Protein: 5.2 g/dL — ABNORMAL LOW (ref 6.5–8.1)

## 2021-09-07 LAB — CBC
HCT: 32.1 % — ABNORMAL LOW (ref 39.0–52.0)
Hemoglobin: 10.2 g/dL — ABNORMAL LOW (ref 13.0–17.0)
MCH: 29.7 pg (ref 26.0–34.0)
MCHC: 31.8 g/dL (ref 30.0–36.0)
MCV: 93.3 fL (ref 80.0–100.0)
Platelets: 138 10*3/uL — ABNORMAL LOW (ref 150–400)
RBC: 3.44 MIL/uL — ABNORMAL LOW (ref 4.22–5.81)
RDW: 16.4 % — ABNORMAL HIGH (ref 11.5–15.5)
WBC: 9.4 10*3/uL (ref 4.0–10.5)
nRBC: 0 % (ref 0.0–0.2)

## 2021-09-07 LAB — HEPARIN LEVEL (UNFRACTIONATED)
Heparin Unfractionated: 0.37 IU/mL (ref 0.30–0.70)
Heparin Unfractionated: 0.35 IU/mL (ref 0.30–0.70)

## 2021-09-07 LAB — CK
Total CK: 5530 U/L — ABNORMAL HIGH (ref 49–397)
Total CK: 2625 U/L — ABNORMAL HIGH (ref 49–397)

## 2021-09-07 MED ORDER — NITROGLYCERIN 0.4 MG SL SUBL: 0.4000 mg | SUBLINGUAL_TABLET | SUBLINGUAL | Status: DC | PRN

## 2021-09-07 MED ORDER — SODIUM CHLORIDE 0.9 % IV SOLN: INTRAVENOUS | Status: AC

## 2021-09-07 MED ORDER — ADULT MULTIVITAMIN W/MINERALS CH
1.0000 | ORAL_TABLET | Freq: Every day | ORAL | Status: DC
  Administered 2021-09-07 – 2021-09-12 (×6): 1 via ORAL
  Filled 2021-09-07 (×6): qty 1

## 2021-09-07 MED ORDER — DARIFENACIN HYDROBROMIDE ER 7.5 MG PO TB24
7.5000 mg | ORAL_TABLET | Freq: Every day | ORAL | Status: DC
  Administered 2021-09-07 – 2021-09-12 (×6): 7.5 mg via ORAL
  Filled 2021-09-07 (×6): qty 1

## 2021-09-07 MED ORDER — METOPROLOL SUCCINATE ER 25 MG PO TB24
25.0000 mg | ORAL_TABLET | Freq: Every day | ORAL | Status: DC
  Administered 2021-09-07 – 2021-09-12 (×6): 25 mg via ORAL
  Filled 2021-09-07 (×6): qty 1

## 2021-09-07 MED ORDER — CHLORHEXIDINE GLUCONATE CLOTH 2 % EX PADS
6.0000 | MEDICATED_PAD | Freq: Every day | CUTANEOUS | Status: DC
  Administered 2021-09-07 – 2021-09-11 (×5): 6 via TOPICAL

## 2021-09-07 MED ORDER — TAMSULOSIN HCL 0.4 MG PO CAPS
0.4000 mg | ORAL_CAPSULE | Freq: Two times a day (BID) | ORAL | Status: DC
  Administered 2021-09-07 – 2021-09-12 (×11): 0.4 mg via ORAL
  Filled 2021-09-07 (×11): qty 1

## 2021-09-07 MED ORDER — POTASSIUM CHLORIDE CRYS ER 20 MEQ PO TBCR
20.0000 meq | EXTENDED_RELEASE_TABLET | Freq: Once | ORAL | Status: AC
  Administered 2021-09-07: 20 meq via ORAL
  Filled 2021-09-07: qty 1

## 2021-09-07 MED ORDER — OXYBUTYNIN CHLORIDE 5 MG PO TABS: 5.0000 mg | ORAL_TABLET | Freq: Two times a day (BID) | ORAL | Status: DC | PRN

## 2021-09-07 MED ORDER — ASPIRIN 81 MG PO CHEW
81.0000 mg | CHEWABLE_TABLET | Freq: Every day | ORAL | Status: DC
  Administered 2021-09-07 – 2021-09-12 (×6): 81 mg via ORAL
  Filled 2021-09-07 (×6): qty 1

## 2021-09-07 NOTE — TOC Progression Note (Signed)
Transition of Care [facility]) - Initial/Assessment Note    Patient Details  Name: Fernando Blankenship MRN: [id number] Date of Birth: Apr 03, 1946  Transition of Care [facility]) CM/SW Contact:    Milinda Antis, LCSWA Phone Number: 09/07/2021, 3:54 PM  Clinical Narrative:                  Transition of Care Department [facility]) has reviewed patient.  At this time PT/OT assessments and recommendations are pending.  We will continue to monitor patient advancement through interdisciplinary progression rounds.  Lind Covert, LCSWA    Patient Goals and CMS Choice        Expected Discharge Plan and Services                                                Prior Living Arrangements/Services                       Activities of Daily Living Home Assistive Devices/Equipment: Eyeglasses, CBG Meter ADL Screening (condition at time of admission) Patient's cognitive ability adequate to safely complete daily activities?: No Is the patient deaf or have difficulty hearing?: No Does the patient have difficulty seeing, even when wearing glasses/contacts?: No Does the patient have difficulty concentrating, remembering, or making decisions?: Yes Patient able to express need for assistance with ADLs?: No Does the patient have difficulty dressing or bathing?: Yes Independently performs ADLs?: No Communication: Independent Dressing (OT): Needs assistance Is this a change from baseline?: Change from baseline, expected to last <3days Grooming: Needs assistance Is this a change from baseline?: Change from baseline, expected to last <3 days Feeding: Independent Bathing: Needs assistance Is this a change from baseline?: Change from baseline, expected to last <3 days Toileting: Needs assistance Is this a change from baseline?: Change from baseline, expected to last <3 days In/Out Bed: Needs assistance Is this a change from baseline?: Change from baseline, expected to last <3 days Walks in Home:  Needs assistance Is this a change from baseline?: Change from baseline, expected to last <3 days Does the patient have difficulty walking or climbing stairs?: Yes Weakness of Legs: Both Weakness of Arms/Hands: None  Permission Sought/Granted                  Emotional Assessment              Admission diagnosis:  Hypoxia [R09.02] Sepsis ([place]) [A41.9] Non-traumatic rhabdomyolysis [M62.82] Altered mental status, unspecified altered mental status type [R41.82] Sepsis, due to unspecified organism, unspecified whether acute organ dysfunction present [facility]) [A41.9] Patient Active Problem List   Diagnosis Date Noted   Sepsis ([place]) 09/06/2021   UTI (urinary tract infection) 09/06/2021   Rhabdomyolysis 09/06/2021   Acute kidney injury ([place]) 09/06/2021   High anion gap metabolic acidosis 70/09/7492   Malignant neoplasm of prostate ([place]) 12/23/2020   Allergic rhinitis 12/22/2020   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   Gastroduodenitis 12/22/2020   History of adenomatous polyp of colon 12/22/2020   Morbid obesity ([place]) 12/22/2020   Diabetes mellitus due to underlying condition with unspecified complications ([place]) 49/67/5916   Sensorineural hearing loss (SNHL), 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 [place] [address] Phone: [phone] Fax: [phone]     Social  Determinants of Health (SDOH) Interventions    Readmission Risk Interventions No flowsheet data found.

## 2021-09-07 NOTE — Progress Notes (Signed)
ANTICOAGULATION CONSULT NOTE - Follow Up Consult  Pharmacy Consult for Heparin Indication: RLE DVT  Allergies  Allergen Reactions   Prozac [Fluoxetine] Anaphylaxis   Other Other (See Comments)    Plavix cause his lips to swell    Patient Measurements: Height: 5\' 7"  (170.2 cm) Weight: 104.3 kg (230 lb) IBW/kg (Calculated) : 66.1 Heparin Dosing Weight: 89 kg  Vital Signs: Temp: 98.3 F (36.8 C) (12/12 1005) Temp Source: Oral (12/12 0558) BP: 101/72 (12/12 1005) Pulse Rate: 86 (12/12 1005)  Labs: Recent Labs    09/05/21 2225 09/06/21 0025 09/06/21 0239 09/06/21 0349 09/06/21 1441 09/07/21 0349 09/07/21 1008  HGB 14.4  --  12.6*  --  12.5* 10.2*  --   HCT 44.3  --  37.0*  --  38.5* 32.1*  --   PLT 160  --   --   --  152 138*  --   HEPARINUNFRC  --   --   --   --  0.18* 0.37 0.35  CREATININE 3.43*  --   --  3.02* 2.42* 2.17*  --   CKTOTAL >4,100*  --   --   --  5,530* 2,625*  --   TROPONINIHS 46* 44*  --   --   --   --   --     Estimated Creatinine Clearance: 33.9 mL/min (A) (by C-G formula based on SCr of 2.17 mg/dL (H)).  Assessment:  75 yr old male on IV heparin for RLE DVT.  VQ scan negative for PE.   Heparin level remains low therapeutic (0.35) on 1700 units/hr.  CBC trended down some, possibly dilutional. Rhabdomyolysis, creatinine and LFTs trending down. Platelet count 160>>138, about 20% drop. No bleeding reported.  Goal of Therapy:  Heparin level 0.3-0.7 units/ml Monitor platelets by anticoagulation protocol: Yes   Plan:  Increase heparin drip to 1800 units/hr to try to keep level in target range. Next heparin level and CBC in am. Monitor for bleeding.  Arty Baumgartner, RPh 09/07/2021,12:20 PM

## 2021-09-07 NOTE — Procedures (Signed)
Patient Name: ADOM SCHOENECK  MRN: [id number]  Epilepsy Attending: Lora Havens  Referring Physician/Provider: Dr Derrick Ravel Date: 09/07/2021 Duration: 27.05 mins  Patient history: 75 year old male with altered mental status.  EEG to evaluate for seizure.  Level of alertness: Awake, asleep  AEDs during EEG study: None  Technical aspects: This EEG study was done with scalp electrodes positioned according to the 10-20 International system of electrode placement. Electrical activity was acquired at a sampling rate of 500Hz  and reviewed with a high frequency filter of 70Hz  and a low frequency filter of 1Hz . EEG data were recorded continuously and digitally stored.   Description: The posterior dominant rhythm consists of 8-9 Hz activity of moderate voltage (25-35 uV) seen predominantly in posterior head regions, symmetric and reactive to eye opening and eye closing. Sleep was characterized by vertex waves, sleep spindles (12 to 14 Hz), maximal frontocentral region. Hyperventilation and photic stimulation were not performed.     IMPRESSION: This study is within normal limits. No seizures or epileptiform discharges were seen throughout the recording.  Annette Liotta Barbra Sarks

## 2021-09-07 NOTE — Progress Notes (Signed)
EEG complete - results pending 

## 2021-09-07 NOTE — Progress Notes (Signed)
ANTICOAGULATION CONSULT NOTE - Follow Up Consult  Pharmacy Consult for heparin  Indication: DVT  Allergies  Allergen Reactions   Prozac [Fluoxetine] Anaphylaxis   Other Other (See Comments)    Plavix cause his lips to swell    Patient Measurements: Height: 5\' 7"  (170.2 cm) Weight: 104.3 kg (230 lb) IBW/kg (Calculated) : 66.1 HEPARIN DW (KG): 89.1   Vital Signs: Temp: 98.5 F (36.9 C) (12/12 0206) Temp Source: Axillary (12/11 2015) BP: 119/82 (12/12 0206) Pulse Rate: 93 (12/12 0206)  Labs: Recent Labs    09/05/21 2225 09/06/21 0025 09/06/21 0239 09/06/21 0349 09/06/21 1441 09/07/21 0349  HGB 14.4  --  12.6*  --  12.5* 10.2*  HCT 44.3  --  37.0*  --  38.5* 32.1*  PLT 160  --   --   --  152 138*  HEPARINUNFRC  --   --   --   --  0.18* 0.37  CREATININE 3.43*  --   --  3.02* 2.42* 2.17*  CKTOTAL >4,100*  --   --   --  5,530*  --   TROPONINIHS 46* 44*  --   --   --   --      Estimated Creatinine Clearance: 33.9 mL/min (A) (by C-G formula based on SCr of 2.17 mg/dL (H)).   Medical History: Past Medical History:  Diagnosis Date   Anginal pain ([place])    "just this week" (09/29/2012) none since 2014 per pt on 04-27-2021   Arthritis    "hands, feet" (09/29/2012)   Bladder incontinence 04/27/2021   wears pads   Coronary artery disease    Diabetic neuropathy ([place]) 04/27/2021   both feet   dm type 2    does not check cbg per pt on 81-09-2020   GERD (gastroesophageal reflux disease) 04/27/2021   H/O hiatal hernia    Hypertension 04/27/2021   Prostate cancer ([place]) 04/27/2021   Restless leg syndrome    Varicose vein of leg    post op dvt 09-2010   Wears glasses 04/27/2021    insulin aspart  0-5 Units Subcutaneous QHS   insulin aspart  0-9 Units Subcutaneous TID WC   pantoprazole  40 mg Oral Daily   rOPINIRole  1 mg Oral QHS    Assessment: 75 yo male on  heparin for DVT.  -heparin level is subtherapeutic at 0.37, hg 12.5> 10.2 (fluid positive, likely  dilutional)  H/H low, Plt wnl. SCr 2.42  Goal of Therapy:  Heparin level 0.3-0.7 units/ml Monitor platelets by anticoagulation protocol: Yes   Plan:  -Continue heparin at 1700 units/hr -Confirm heparin level later today -Daily heparin level and CBC  Hildred Laser, PharmD Clinical Pharmacist **Pharmacist phone directory can now be found on [email or web address] ([email or web address]).  Listed under [place].

## 2021-09-07 NOTE — Progress Notes (Signed)
PROGRESS NOTE    Fernando Blankenship  ESP:[id number] DOB: [birth year] DOA: 09/05/2021 PCP: Lavone Orn, MD   Chief Complaint  Patient presents with   Altered Mental Status  Brief Narrative/Hospital Course: Fernando Blankenship, 75 y.o. male with PMH of  CAD status post PCI in 2014, hypertension, hyperlipidemia, GERD, non-insulin-dependent type 2 diabetes, prostate cancer status post radioactive seed implant in August 2022, RLS, BPH presented to the ED via EMS for evaluation of altered mental status.  Patient lives by himself was seen 2 to 3 days prior to admission, was not answering call and had to break into residents and was found down on the ground incontinent of urine and feces, was confused delirious and hypoxic on room air placed on oxygen and brought to the ED  In the ED, hypoxic to 80s on room air, lab with leukocytosis 17 K, hemoglobin 14.4 and dropped to 12.6 after fluid boluses, mild hyperkalemia, was also febrile 103.5 tachycardic, tachypneic.  Chest x-ray no pneumonia CT head no acute finding,CT scan abdomen with features of cystitis, nonspecific fluid collection in the left pericolic gutter suspicious for hemorrhage, EDP consulted the trauma surgery, patient was also placed on bicarb infusion and heparin drip as pulmonary embolism is being ruled out.  Chest x-ray unremarkable.  Subjective: Seen and examined this morning. Patient is alert awake resting comfortably appears confused talking with daughter on the phone from [place]. He denies any nausea vomiting abdominal pain.  He thinks he is underwater but able to tell me current president and current date on the portal Patient had a fever of 101.3 8 PM last night  Assessment & Plan:  Severe sepsis POA Complicated UTI: At baseline does intermittent self catheterization at home since his prostate cancer treatment Hemodynamically stable, leukocytosis has resolved.  Has had multiple ED visits per previous urine retention in October.  Urine  culture with staph epidermidis and Pseudomonas blood culture no growth so far.  On cefepime. Recent Labs  Lab 09/05/21 2225 09/06/21 0022 09/06/21 1441 09/07/21 0349  WBC 17.0*  --  12.9* 9.4  LATICACIDVEN  --  1.9  --   --     Acute metabolic encephalopathy due to sepsis UTI: Still remains confused but much more alert awake oriented x2.  Continue PT OT, supportive care  Free fluid in left paracolic gutter, unclear etiology: Patient has no abdominal pain or complaint hemoglobin has been notably stable so doubt solid organ injury per trauma surgery.  Doubt perforated viscus no further surgical intervention and have signed off.  Patient is tolerating diet  Right lower extremity DVT, negative VQ scan.  Continue on heparin drip pharmacy dosing monitor hemoglobin count  AKI metabolic acidosis Hyperkalemia on presentation: Renal function is nicely improving off bicarb drip, potassium is stable CT abdomen pelvis as above.  Continue gentle IV hydration CK improving  Mild hypokalemia replete orally  Mild rhabdomyolysis was found on the floor, CK improving trend lab continue hydration  Mild transaminitis likely from rhabdomyolysis and sepsis.  Trend.  CAD status post PCI in 2014 Positive troponin 46-44 flat no delta likely demand ischemia in the setting of sepsis.  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  Keep on SSI.  A1c stable 6.6 Recent Labs  Lab 09/06/21 1206 09/06/21  [place] 09/06/21 2159 09/07/21 0640 09/07/21 1131  GLUCAP 156* 197* 182* 160* 237*    Class II Obesity:Patient's Body mass index is 36.02 kg/m. : Will benefit with PCP follow-up, weight loss  healthy lifestyle and  outpatient sleep evaluation.  Deconditioning found on down on the floor: Obtain PT OT evaluation will likely need skilled nursing facility placement  DVT prophylaxis: heparin  Code Status:   Code Status: Full Code Family Communication: plan of care discussed with patient and daughter  on phone Status is: Inpatient Remains inpatient appropriate because: Due to ongoing management of his renal failure DVT and confusion Disposition: Currently not medically stable for discharge. Anticipated Disposition: SNF anticipated  Objective: Vitals last 24 hrs: Vitals:   09/06/21 2200 09/07/21 0206 09/07/21 0558 09/07/21 1005  BP: 110/70 119/82 114/74 101/72  Pulse: 96 93 82 86  Resp: 18 18 18 18   Temp: 98.5 F (36.9 C) 98.5 F (36.9 C) 98.1 F (36.7 C) 98.3 F (36.8 C)  TempSrc:   Oral   SpO2: 96% 95% 97% 96%  Weight:      Height:       Weight change:   Intake/Output Summary (Last 24 hours) at 09/07/2021 1310 Last data filed at 09/07/2021 1249 Gross per 24 hour  Intake 2202.15 ml  Output 1701 ml  Net 501.15 ml   Net IO Since Admission: 4,101.97 mL [09/07/21 1310]   Physical Examination: General exam: AA0x3, weak,obvese, older than stated age. HEENT:Oral mucosa moist, Ear/Nose WNL grossly,dentition normal. Respiratory system: B/l diminished, BS, no use of accessory muscle, non tender. Cardiovascular system: S1 & S2 + No JVD. Gastrointestinal system: Abdomen soft, NT,ND, BS+. Nervous System:Alert, awake, moving extremities. Extremities: edema nonedistal peripheral pulses palpable.  Skin: No rashes, no icterus. MSK: Normal muscle bulk, tone, power.  Medications reviewed:  Scheduled Meds:  aspirin  81 mg Oral Daily   darifenacin  7.5 mg Oral Daily   insulin aspart  0-5 Units Subcutaneous QHS   insulin aspart  0-9 Units Subcutaneous TID WC   metoprolol succinate  25 mg Oral Daily   multivitamin  1 capsule Oral Daily   pantoprazole  40 mg Oral Daily   rOPINIRole  1 mg Oral QHS    tamsulosin  0.4 mg Oral BID   Continuous Infusions:  sodium chloride 75 mL/hr at 09/07/21 1249   ceFEPime (MAXIPIME) IV Stopped (09/07/21 1244)   heparin 1,800 Units/hr (09/07/21 1249)   Diet Order             Diet heart healthy/carb modified Room service appropriate? Yes; Fluid consistency: Thin  Diet effective now                          Weight change:   Wt Readings from Last 3 Encounters:  09/05/21 104.3 kg  07/01/21 104.3 kg  05/21/21 112.8 kg     Consultants:see note  Procedures:see note Antimicrobials: Anti-infectives (From admission, onward)    Start     Dose/Rate Route Frequency Ordered Stop   09/07/21 1200  ceFEPIme (MAXIPIME) 2 g in sodium chloride 0.9 % 100 mL IVPB        2 g 200 mL/hr over 30 Minutes Intravenous Every 24 hours 09/06/21 0104 09/14/21 1159   09/06/21 1435  vancomycin variable dose per unstable renal function (pharmacist dosing)  Status:  Discontinued         Does not apply See admin instructions 09/06/21 1435 09/06/21 1443   09/05/21 2330  vancomycin (VANCOREADY) IVPB 2000 mg/400 mL        2,000 mg 200 mL/hr over 120 Minutes Intravenous  Once 09/05/21 2318 09/06/21 0227   09/05/21 2315  ceFEPIme (MAXIPIME) 2 g in sodium chloride 0.9 % 100 mL IVPB        2 g 200 mL/hr over 30 Minutes Intravenous  Once 09/05/21 2312 09/06/21 0004   09/05/21 2315  metroNIDAZOLE (FLAGYL) IVPB 500 mg        500 mg 100 mL/hr over 60 Minutes Intravenous  Once 09/05/21 2312 09/06/21 0023   09/05/21 2315  vancomycin (VANCOCIN) IVPB 1000 mg/200 mL premix  Status:  Discontinued        1,000 mg 200 mL/hr over 60 Minutes Intravenous  Once 09/05/21 2312 09/05/21 2318      Culture/Microbiology    Component Value Date/Time   SDES URINE, CLEAN CATCH 09/06/2021 0428   SPECREQUEST NONE 09/06/2021 0428   CULT (A) 09/06/2021 0428    >=100,000 COLONIES/mL STAPHYLOCOCCUS EPIDERMIDIS >=100,000 COLONIES/mL PSEUDOMONAS AERUGINOSA SUSCEPTIBILITIES TO  FOLLOW Performed at [facility] [address] [place], [place] [address]    REPTSTATUS PENDING 09/06/2021 5956    Other culture-see note  Unresulted Labs (From admission, onward)     Start     Ordered   09/08/21 0500  Heparin level (unfractionated)  Daily,   R      09/06/21 1710   09/08/21 0500  CK  Daily,   R      09/07/21 0800   09/07/21 0500  CBC  Daily,   R      09/06/21 0354   09/06/21 1313  MRSA Next Gen by PCR, Nasal  Once,   R        09/06/21 1312          Data Reviewed: I have personally reviewed following labs and imaging studies CBC: Recent Labs  Lab 09/05/21 2225 09/06/21 0239 09/06/21 1441 09/07/21 0349  WBC 17.0*  --  12.9* 9.4  NEUTROABS 15.0*  --   --   --   HGB 14.4 12.6* 12.5* 10.2*  HCT 44.3 37.0* 38.5* 32.1*  MCV 92.1  --  93.2 93.3  PLT 160  --  152 387*   Basic Metabolic Panel: Recent Labs  Lab 09/05/21 2225 09/06/21 0239 09/06/21 0349 09/06/21 1441 09/07/21 0349  NA 134* 135 132* 134* 134*  K 5.3* 3.6 3.9 3.4* 3.3*  CL 99  --  97* 97* 98  CO2 18*  --  24 27 26   GLUCOSE 285*  --  228* 165* 184*  BUN 51*  --  47* 47* 46*  CREATININE 3.43*  --  3.02* 2.42* 2.17*  CALCIUM 8.6*  --  7.9* 8.2* 7.9*   GFR: Estimated Creatinine Clearance: 33.9 mL/min (A) (by C-G formula based on SCr of 2.17 mg/dL (H)). Liver Function Tests: Recent Labs  Lab 09/05/21 2225 09/06/21 1441 09/07/21 0349  AST 164* 147* 101*  ALT 48* 50* 47*  ALKPHOS 45 62 39  BILITOT 3.3* 2.2* 1.5*  PROT 6.6 5.7* 5.2*  ALBUMIN 3.1* 2.5* 2.3*   No results for input(s): LIPASE, AMYLASE in the last 168 hours. No results for input(s): AMMONIA in the last 168 hours. Coagulation Profile: No results for input(s): INR, PROTIME in the last 168 hours. Cardiac Enzymes: Recent Labs  Lab 09/05/21 2225 09/06/21 1441 09/07/21 0349  CKTOTAL >4,100* 5,530* 2,625*   BNP (last 3 results) No results for input(s): PROBNP in the last  8760 hours. HbA1C: Recent Labs     09/06/21 1441  HGBA1C 6.6*   CBG: Recent Labs  Lab 09/06/21 1206 09/06/21 1740 09/06/21 2159 09/07/21 0640 09/07/21 1131  GLUCAP 156* 197* 182* 160* 237*   Lipid Profile: No results for input(s): CHOL, HDL, LDLCALC, TRIG, CHOLHDL, LDLDIRECT in the last 72 hours. Thyroid Function Tests: No results for input(s): TSH, T4TOTAL, FREET4, T3FREE, THYROIDAB in the last 72 hours. Anemia Panel: No results for input(s): VITAMINB12, FOLATE, FERRITIN, TIBC, IRON, RETICCTPCT in the last 72 hours. Sepsis Labs: Recent Labs  Lab 09/06/21 0022  LATICACIDVEN 1.9    Recent Results (from the past 240 hour(s))  Blood culture (routine x 2)     Status: None (Preliminary result)   Collection Time: 09/05/21 11:05 PM   Specimen: BLOOD LEFT FOREARM  Result Value Ref Range Status   Specimen Description BLOOD LEFT FOREARM  Final   Special Requests   Final    BOTTLES DRAWN AEROBIC AND ANAEROBIC Blood Culture adequate volume   Culture   Final    NO GROWTH 2 DAYS Performed at [facility], [address], [place], [place] [address]    Report Status PENDING  Incomplete  Resp Panel by RT-PCR (Flu A&B, Covid)     Status: None   Collection Time: 09/05/21 11:13 PM   Specimen: Nasopharyngeal(NP) swabs in vial transport medium  Result Value Ref Range Status   SARS Coronavirus 2 by RT PCR NEGATIVE NEGATIVE Final    Comment: (NOTE) SARS-CoV-2 target nucleic acids are NOT DETECTED.  The SARS-CoV-2 RNA is generally detectable in upper respiratory specimens during the acute phase of infection. The lowest concentration of SARS-CoV-2 viral copies this assay can detect is 138 copies/mL. A negative result does not preclude SARS-Cov-2 infection and should not be used as the sole basis for treatment or other patient management decisions. A negative result may occur with  improper specimen collection/handling, submission of specimen other than nasopharyngeal swab, presence of viral mutation(s) within  the areas targeted by this assay, and inadequate number of viral copies(<138 copies/mL). A negative result must be combined with clinical observations, patient history, and epidemiological information. The expected result is Negative.  Fact Sheet for Patients:  EntrepreneurPulse.com.au  Fact Sheet for Healthcare Providers:  IncredibleEmployment.be  This test is no t yet approved or cleared by the [place] FDA and  has been authorized for detection and/or diagnosis of SARS-CoV-2 by FDA under an Emergency Use Authorization (EUA). This EUA will remain  in effect (meaning this test can be used) for the duration of the COVID-19 declaration under Section 564(b)(1) of the Act, 21 U.S.C.section 360bbb-3(b)(1), unless the authorization is terminated  or revoked sooner.       Influenza A by PCR NEGATIVE NEGATIVE Final   Influenza B by PCR NEGATIVE NEGATIVE Final    Comment: (NOTE) The Xpert Xpress SARS-CoV-2/FLU/RSV plus assay is intended as an aid in the diagnosis of influenza from Nasopharyngeal swab specimens and should not be used as a sole basis for treatment. Nasal washings and aspirates are unacceptable for Xpert Xpress SARS-CoV-2/FLU/RSV testing.  Fact Sheet for Patients: EntrepreneurPulse.com.au  Fact Sheet for Healthcare Providers: IncredibleEmployment.be  This test is not yet approved or cleared by the [place] FDA and has been authorized for detection and/or diagnosis of SARS-CoV-2 by FDA under an Emergency Use Authorization (EUA). This EUA will remain in effect (meaning this test can be used) for the duration of the COVID-19 declaration under Section 564(b)(1) of  the Act, 21 U.S.C. section 360bbb-3(b)(1), unless the authorization is terminated or revoked.  Performed at [facility], [place] [address] [place], [place] [address]   Blood culture (routine x 2)     Status: None  (Preliminary result)   Collection Time: 09/05/21 11:14 PM   Specimen: BLOOD RIGHT HAND  Result Value Ref Range Status   Specimen Description BLOOD RIGHT HAND  Final   Special Requests   Final    BOTTLES DRAWN AEROBIC AND ANAEROBIC Blood Culture results may not be optimal due to an inadequate volume of blood received in culture bottles   Culture   Final    NO GROWTH 2 DAYS Performed at [facility], [place] [address], [place], [place] [address]    Report Status PENDING  Incomplete  Urine Culture     Status: Abnormal (Preliminary result)   Collection Time: 09/06/21  4:28 AM   Specimen: Urine, Clean Catch  Result Value Ref Range Status   Specimen Description URINE, CLEAN CATCH  Final   Special Requests NONE  Final   Culture (A)  Final    >=100,000 COLONIES/mL STAPHYLOCOCCUS EPIDERMIDIS >=100,000 COLONIES/mL PSEUDOMONAS AERUGINOSA SUSCEPTIBILITIES TO FOLLOW Performed at [facility], [place] [address], [place], [place] [address]    Report Status PENDING  Incomplete     Radiology Studies: CT HEAD WO CONTRAST (5MM)  Result Date: 09/05/2021 CLINICAL DATA:  Delirium EXAM: CT HEAD WITHOUT CONTRAST TECHNIQUE: Contiguous axial images were obtained from the base of the skull through the vertex without intravenous contrast. COMPARISON:  06/23/2004 FINDINGS: Brain: No evidence of acute infarction, hemorrhage, hydrocephalus, extra-axial collection or mass lesion/mass effect. Mild subcortical white matter and periventricular small vessel ischemic changes. Vascular: No hyperdense vessel or unexpected calcification. Skull: Normal. Negative for fracture or focal lesion. Sinuses/Orbits: The visualized paranasal sinuses are essentially clear. The mastoid air cells are unopacified. Other: None. IMPRESSION: No evidence of acute intracranial abnormality. Mild small vessel ischemic changes. Electronically Signed   By: Julian Hy M.D.   On: 09/05/2021 23:38   NM Pulmonary Perfusion  Result  Date: 09/06/2021 CLINICAL DATA:  75 year old male with chest pain and chronic cough. Tachycardic. EXAM: NUCLEAR MEDICINE PERFUSION LUNG SCAN TECHNIQUE: Perfusion images were obtained in multiple projections after intravenous injection of radiopharmaceutical. RADIOPHARMACEUTICALS:  4.1 mCi Tc-76m MAA COMPARISON:  CT chest 09/06/2021 FINDINGS: Single linear defect in the posterior LEFT lower lobe seen on posterior projection. This perfusion defect is favored to correspond atelectasis on comparison CT. No wedge-shaped peripheral perfusion defects to suggest acute pulmonary. IMPRESSION: 1. No evidence acute pulmonary embolism. 2. LEFT basilar atelectasis. Electronically Signed   By: Suzy Bouchard M.D.   On: 09/06/2021 13:17   DG Chest Portable 1 View  Result Date: 09/05/2021 CLINICAL DATA:  Altered level of consciousness, history of prostate cancer EXAM: PORTABLE CHEST 1 VIEW COMPARISON:  02/12/2021 FINDINGS: Single frontal view of the chest demonstrates an unremarkable cardiac silhouette. No acute airspace disease, effusion, or pneumothorax. No acute bony abnormalities. IMPRESSION: 1. No acute intrathoracic process. Electronically Signed   By: Randa Ngo M.D.   On: 09/05/2021 23:00   ECHOCARDIOGRAM COMPLETE  Result Date: 09/06/2021    ECHOCARDIOGRAM REPORT   Patient Name:   Fernando Blankenship Date of Exam: 09/06/2021 Medical Rec #:  [id number]      Height:       67.0 in Accession #:    [id number]     Weight:       230.0  lb Date of Birth:  Jan 15, 1946      BSA:          2.146 m Patient Age:    1 years       BP:           107/70 mmHg Patient Gender: M              HR:           105 bpm. Exam Location:  Inpatient Procedure: 2D Echo, Cardiac Doppler and Color Doppler Indications:    Syncope  History:        Patient has no prior history of Echocardiogram examinations.                 CAD; Risk Factors:Diabetes and Hypertension.  Sonographer:    Maudry Mayhew MHA, RDMS, RVT, RDCS Referring Phys: [id number]  [facility]  Sonographer Comments: Image acquisition challenging due to respiratory motion. IMPRESSIONS  1. Left ventricular ejection fraction, by estimation, is 60 to 65%. The left ventricle has normal function. Left ventricular endocardial border not optimally defined to evaluate regional wall motion. Indeterminate diastolic filling due to E-A fusion.  2. Right ventricular systolic function is normal. The right ventricular size is normal. There is normal pulmonary artery systolic pressure. The estimated right ventricular systolic pressure is 8.4 mmHg.  3. The mitral valve is grossly normal. No evidence of mitral valve regurgitation. No evidence of mitral stenosis.  4. The aortic valve is tricuspid. There is mild calcification of the aortic valve. Aortic valve regurgitation is not visualized. Aortic valve sclerosis is present, with no evidence of aortic valve stenosis.  5. The inferior vena cava is normal in size with greater than 50% respiratory variability, suggesting right atrial pressure of 3 mmHg. FINDINGS  Left Ventricle: Left ventricular ejection fraction, by estimation, is 60 to 65%. The left ventricle has normal function. Left ventricular endocardial border not optimally defined to evaluate regional wall motion. The left ventricular internal cavity size was normal in size. There is no left ventricular hypertrophy. Indeterminate diastolic filling due to E-A fusion. Right Ventricle: The right ventricular size is normal. No increase in right ventricular wall thickness. Right ventricular systolic function is normal. There is normal pulmonary artery systolic pressure. The tricuspid regurgitant velocity is 1.16 m/s, and  with an assumed right atrial pressure of 3 mmHg, the estimated right ventricular systolic pressure is 8.4 mmHg. Left Atrium: Left atrial size was normal in size. Right Atrium: Right atrial size was normal in size. Pericardium: Trivial pericardial effusion is present. Mitral Valve: The  mitral valve is grossly normal. No evidence of mitral valve regurgitation. No evidence of mitral valve stenosis. Tricuspid Valve: The tricuspid valve is grossly normal. Tricuspid valve regurgitation is not demonstrated. No evidence of tricuspid stenosis. Aortic Valve: The aortic valve is tricuspid. There is mild calcification of the aortic valve. Aortic valve regurgitation is not visualized. Aortic valve sclerosis is present, with no evidence of aortic valve stenosis. Aortic valve mean gradient measures 4.0 mmHg. Aortic valve peak gradient measures 8.1 mmHg. Aortic valve area, by VTI measures 2.38 cm. Pulmonic Valve: The pulmonic valve was grossly normal. Pulmonic valve regurgitation is not visualized. No evidence of pulmonic stenosis. Aorta: The aortic root is normal in size and structure. Venous: The inferior vena cava is normal in size with greater than 50% respiratory variability, suggesting right atrial pressure of 3 mmHg. IAS/Shunts: The atrial septum is grossly normal.  LEFT VENTRICLE PLAX 2D LVIDd:  5.30 cm     Diastology LVIDs:         4.70 cm     LV e' medial:    5.77 cm/s LV PW:         0.70 cm     LV E/e' medial:  13.2 LV IVS:        0.55 cm     LV e' lateral:   5.87 cm/s LVOT diam:     1.80 cm     LV E/e' lateral: 13.0 LV SV:         49 LV SV Index:   23 LVOT Area:     2.54 cm    3D Volume EF                            LV 3D EDV:   152.85 ml                            LV 3D ESV:   34.12 ml LV Volumes (MOD) LV vol d, MOD A2C: 52.1 ml LV vol d, MOD A4C: 72.0 ml LV vol s, MOD A2C: 25.4 ml LV vol s, MOD A4C: 32.3 ml LV SV MOD A2C:     26.7 ml LV SV MOD A4C:     72.0 ml LV SV MOD BP:      34.4 ml RIGHT VENTRICLE RV S prime:     14.10 cm/s TAPSE (M-mode): 1.5 cm LEFT ATRIUM             Index        RIGHT ATRIUM           Index LA diam:        2.80 cm 1.30 cm/m   RA Area:     12.50 cm LA Vol (A2C):   44.9 ml 20.92 ml/m  RA Volume:   27.80 ml  12.95 ml/m LA Vol (A4C):   33.0 ml 15.38 ml/m LA  Biplane Vol: 40.8 ml 19.01 ml/m  AORTIC VALVE AV Area (Vmax):    2.20 cm AV Area (Vmean):   2.31 cm AV Area (VTI):     2.38 cm AV Vmax:           142.00 cm/s AV Vmean:          93.100 cm/s AV VTI:            0.206 m AV Peak Grad:      8.1 mmHg AV Mean Grad:      4.0 mmHg LVOT Vmax:         123.00 cm/s LVOT Vmean:        84.500 cm/s LVOT VTI:          0.193 m LVOT/AV VTI ratio: 0.94  AORTA Ao Root diam: 3.00 cm MITRAL VALVE                TRICUSPID VALVE MV Area (PHT): 2.50 cm     TR Peak grad:   5.4 mmHg MV Decel Time: 304 msec     TR Vmax:        116.00 cm/s MV E velocity: 76.40 cm/s MV A velocity: 107.00 cm/s  SHUNTS MV E/A ratio:  0.71         Systemic VTI:  0.19 m  Systemic Diam: 1.80 cm Eleonore Chiquito MD Electronically signed by Eleonore Chiquito MD Signature Date/Time: 09/06/2021/1:20:31 PM    Final    VAS US CAROTID  Result Date: 09/06/2021 Carotid Arterial Duplex Study Patient Name:  Fernando Blankenship  Date of Exam:   09/06/2021 Medical Rec #: [id number]       Accession #:    [id number] Date of Birth: [birth year]       Patient Gender: M Patient Age:   57 years Exam Location[facility]  [facility] Procedure:      VAS US CAROTID Referring Phys: Wandra Feinstein RATHORE --------------------------------------------------------------------------------  Indications:       Syncope. Risk Factors:      Hypertension, Diabetes, past history of smoking, coronary                    artery disease. Comparison Study:  No prior study Performing Technologist: Maudry Mayhew MHA, RDMS, RVT, RDCS  Examination Guidelines: A complete evaluation includes B-mode imaging, spectral Doppler, color Doppler, and power Doppler as needed of all accessible portions of each vessel. Bilateral testing is considered an integral part of a complete examination. Limited examinations for reoccurring indications may be performed as noted.  Right Carotid Findings:  +----------+--------+--------+--------+----------------------------+--------+           PSV cm/sEDV cm/sStenosisPlaque Description          Comments +----------+--------+--------+--------+----------------------------+--------+ CCA Prox  83      24                                                   +----------+--------+--------+--------+----------------------------+--------+ CCA Distal80      14              smooth and heterogenous              +----------+--------+--------+--------+----------------------------+--------+ ICA Prox  82      30              smooth and homogeneous               +----------+--------+--------+--------+----------------------------+--------+ ICA Distal113     37                                                   +----------+--------+--------+--------+----------------------------+--------+ ECA       107     21              hyperechoic and heterogenous         +----------+--------+--------+--------+----------------------------+--------+ +----------+--------+-------+----------------+-------------------+           PSV cm/sEDV cmsDescribe        Arm Pressure (mmHG) +----------+--------+-------+----------------+-------------------+ [id number]             Multiphasic, WNL                    +----------+--------+-------+----------------+-------------------+ +---------+--------+--+--------+--+---------+ VertebralPSV cm/s41EDV cm/s13Antegrade +---------+--------+--+--------+--+---------+  Left Carotid Findings: +----------+-------+--------+--------+--------------------------------+--------+           PSV    EDV cm/sStenosisPlaque Description              Comments           cm/s                                                            +----------+-------+--------+--------+--------------------------------+--------+  CCA Prox  100    24              focal and heterogenous                    +----------+-------+--------+--------+--------------------------------+--------+ CCA Distal101    25                                                       +----------+-------+--------+--------+--------------------------------+--------+ ICA Prox  54     19              focal, hyperechoic and                                                    heterogenous                             +----------+-------+--------+--------+--------------------------------+--------+ ICA Distal84     33                                                       +----------+-------+--------+--------+--------------------------------+--------+ ECA       58     13              heterogenous and hyperechoic             +----------+-------+--------+--------+--------------------------------+--------+ +----------+--------+--------+----------------+-------------------+           PSV cm/sEDV cm/sDescribe        Arm Pressure (mmHG) +----------+--------+--------+----------------+-------------------+ [id number]             Multiphasic, WNL                    +----------+--------+--------+----------------+-------------------+ +---------+--------+--+--------+--+---------+ VertebralPSV cm/s46EDV cm/s15Antegrade +---------+--------+--+--------+--+---------+   Summary: Right Carotid: Velocities in the right ICA are consistent with a 1-39% stenosis. Left Carotid: Velocities in the left ICA are consistent with a 1-39% stenosis. Vertebrals:  Bilateral vertebral arteries demonstrate antegrade flow. Subclavians: Normal flow hemodynamics were seen in bilateral subclavian              arteries. *See table(s) above for measurements and observations.  Electronically signed by Harold Barban MD on 09/06/2021 at 26:46:53 PM.    Final    VAS [place] LOWER EXTREMITY VENOUS (DVT)  Result Date: 09/06/2021  Lower Venous DVT Study Patient Name:  Fernando Blankenship  Date of Exam:   09/06/2021 Medical Rec #: [id number]        Accession #:    [id number] Date of Birth: March 09, 1946       Patient Gender: M Patient Age:   32 years Exam Location:  [facility] Procedure:      VAS [place] LOWER EXTREMITY VENOUS (DVT) Referring Phys: Wandra Feinstein RATHORE --------------------------------------------------------------------------------  Indications: Edema.  Comparison Study: No prior study Performing Technologist: Maudry Mayhew MHA, RDMS, RVT, RDCS  Examination Guidelines: A complete evaluation includes B-mode imaging, spectral Doppler, color Doppler, and power Doppler as needed of all accessible portions of each vessel. Bilateral testing is considered an  integral part of a complete examination. Limited examinations for reoccurring indications may be performed as noted. The reflux portion of the exam is performed with the patient in reverse Trendelenburg.  +---------+---------------+---------+-----------+----------+--------------+ RIGHT    CompressibilityPhasicitySpontaneityPropertiesThrombus Aging +---------+---------------+---------+-----------+----------+--------------+ CFV      Full           Yes      Yes                                 +---------+---------------+---------+-----------+----------+--------------+ SFJ      Full                                                        +---------+---------------+---------+-----------+----------+--------------+ FV Prox  Full                                                        +---------+---------------+---------+-----------+----------+--------------+ FV Mid   Full                                                        +---------+---------------+---------+-----------+----------+--------------+ FV DistalFull                                                        +---------+---------------+---------+-----------+----------+--------------+ PFV      Full                                                         +---------+---------------+---------+-----------+----------+--------------+ POP      Full           Yes      Yes                                 +---------+---------------+---------+-----------+----------+--------------+ PTV      Full                                                        +---------+---------------+---------+-----------+----------+--------------+ PERO     None                    No                   Acute          +---------+---------------+---------+-----------+----------+--------------+   +---------+---------------+---------+-----------+----------+--------------+ LEFT     CompressibilityPhasicitySpontaneityPropertiesThrombus Aging +---------+---------------+---------+-----------+----------+--------------+ CFV      Full  Yes      Yes                                 +---------+---------------+---------+-----------+----------+--------------+ SFJ      Full                                                        +---------+---------------+---------+-----------+----------+--------------+ FV Prox  Full                                                        +---------+---------------+---------+-----------+----------+--------------+ FV Mid   Full                                                        +---------+---------------+---------+-----------+----------+--------------+ FV DistalFull                                                        +---------+---------------+---------+-----------+----------+--------------+ PFV      Full                                                        +---------+---------------+---------+-----------+----------+--------------+ POP      Full           Yes      Yes                                 +---------+---------------+---------+-----------+----------+--------------+ PTV      Full                                                         +---------+---------------+---------+-----------+----------+--------------+ PERO     Full                                                        +---------+---------------+---------+-----------+----------+--------------+     Summary: RIGHT: - Findings consistent with acute deep vein thrombosis involving the right peroneal veins. - No cystic structure found in the popliteal fossa.  LEFT: - There is no evidence of deep vein thrombosis in the lower extremity.  - No cystic structure found in the popliteal fossa.  *See table(s) above for measurements and observations. Electronically signed by Harold Barban MD on 09/06/2021 at 7:47:09 PM.  Final    CT CHEST ABDOMEN PELVIS WO CONTRAST  Result Date: 09/06/2021 CLINICAL DATA:  Found down, altered mental status, abdominal pain EXAM: CT CHEST, ABDOMEN AND PELVIS WITHOUT CONTRAST TECHNIQUE: Multidetector CT imaging of the chest, abdomen and pelvis was performed following the standard protocol without IV contrast. COMPARISON:  Chest radiograph dated 09/05/2021 FINDINGS: CT CHEST FINDINGS Cardiovascular: The heart is normal in size. No pericardial effusion. No evidence of thoracic aortic aneurysm. Atherosclerotic calcifications of the arch. Coronary atherosclerosis of the LAD. Mediastinum/Nodes: No suspicious mediastinal lymphadenopathy. Visualized thyroid is unremarkable. Lungs/Pleura: Mild paraseptal emphysematous changes, upper lung predominant. No suspicious pulmonary nodules. Mild dependent atelectasis in the bilateral lower lobes. No focal consolidation. No pleural effusion or pneumothorax. Musculoskeletal: Degenerative changes of the thoracic spine. CT ABDOMEN PELVIS FINDINGS Hepatobiliary: Unenhanced liver is unremarkable, noting focal fat along the gallbladder fossa. Gallbladder is unremarkable. No intrahepatic or extrahepatic ductal dilatation. Pancreas: Within normal limits. Spleen: Within normal limits. Adrenals/Urinary Tract: Mild nodular thickening of  the right adrenal gland. 14 mm left adrenal nodule (series 3/image 63), indeterminate but likely reflecting a benign adrenal adenoma. Kidneys are within normal limits, noting nonspecific perinephric stranding. No renal, ureteral, or bladder calculi. No hydronephrosis. Thick-walled bladder, correlate for cystitis. Stomach/Bowel: Stomach is within normal limits. No evidence of bowel obstruction. Normal appendix (series 3/image 95). Left colonic diverticulosis, without evidence of diverticulitis. Vascular/Lymphatic: No evidence of abdominal aortic aneurysm. Atherosclerotic calcifications of the abdominal aorta and branch vessels. No suspicious abdominopelvic lymphadenopathy. Reproductive: Brachytherapy seeds along the prostate. Other: Mild complex fluid/hemorrhage along the left pericolic gutter (series 3/image 79). Musculoskeletal: Mild degenerative changes of the lumbar spine. IMPRESSION: Thick-walled bladder, correlate for cystitis. Brachytherapy seeds along the prostate. No findings suspicious for metastatic disease. Mild complex fluid/hemorrhage along the left pericolic gutter, nonspecific. No acute cardiopulmonary abnormality. Electronically Signed   By: Julian Hy M.D.   On: 09/06/2021 02:47     LOS: 1 day   Antonieta Pert, MD [facility]  09/07/2021, 1:10 PM

## 2021-09-07 NOTE — Plan of Care (Signed)

## 2021-09-07 NOTE — Progress Notes (Signed)
Subjective: No abdominal pain this morning.  Tolerating solid diet with no issues.  ROS: See above, otherwise other systems negative  Objective: Vital signs in last 24 hours: Temp:  [98.1 F (36.7 C)-101.3 F (38.5 C)] 98.1 F (36.7 C) (12/12 0558) Pulse Rate:  [82-115] 82 (12/12 0558) Resp:  [3-30] 18 (12/12 0558) BP: (92-184)/(60-172) 114/74 (12/12 0558) SpO2:  [90 %-97 %] 97 % (12/12 0558) Last BM Date: 09/06/21  Intake/Output from previous day: 12/11 0701 - 12/12 0700 In: 1314.7 [I.V.:1314.7] Out: 1000 [Urine:1000] Intake/Output this shift: Total I/O In: 300 [P.O.:300] Out: -   PE: Abd: soft, NT, ND, obese, +BS  Lab Results:  Recent Labs    09/06/21 1441 09/07/21 0349  WBC 12.9* 9.4  HGB 12.5* 10.2*  HCT 38.5* 32.1*  PLT 152 138*   BMET Recent Labs    09/06/21 1441 09/07/21 0349  NA 134* 134*  K 3.4* 3.3*  CL 97* 98  CO2 27 26  GLUCOSE 165* 184*  BUN 47* 46*  CREATININE 2.42* 2.17*  CALCIUM 8.2* 7.9*   PT/INR No results for input(s): LABPROT, INR in the last 72 hours. CMP     Component Value Date/Time   NA 134 (L) 09/07/2021 0349   K 3.3 (L) 09/07/2021 0349   CL 98 09/07/2021 0349   CO2 26 09/07/2021 0349   GLUCOSE 184 (H) 09/07/2021 0349   BUN 46 (H) 09/07/2021 0349   CREATININE 2.17 (H) 09/07/2021 0349   CALCIUM 7.9 (L) 09/07/2021 0349   PROT 5.2 (L) 09/07/2021 0349   ALBUMIN 2.3 (L) 09/07/2021 0349   AST 101 (H) 09/07/2021 0349   ALT 47 (H) 09/07/2021 0349   ALKPHOS 39 09/07/2021 0349   BILITOT 1.5 (H) 09/07/2021 0349   GFRNONAA 31 (L) 09/07/2021 0349   GFRAA 83 (L) 09/30/2012 0530   Lipase  No results found for: LIPASE     Studies/Results: CT HEAD WO CONTRAST (5MM)  Result Date: 09/05/2021 CLINICAL DATA:  Delirium EXAM: CT HEAD WITHOUT CONTRAST TECHNIQUE: Contiguous axial images were obtained from the base of the skull through the vertex without intravenous contrast. COMPARISON:  06/23/2004 FINDINGS: Brain: No  evidence of acute infarction, hemorrhage, hydrocephalus, extra-axial collection or mass lesion/mass effect. Mild subcortical white matter and periventricular small vessel ischemic changes. Vascular: No hyperdense vessel or unexpected calcification. Skull: Normal. Negative for fracture or focal lesion. Sinuses/Orbits: The visualized paranasal sinuses are essentially clear. The mastoid air cells are unopacified. Other: None. IMPRESSION: No evidence of acute intracranial abnormality. Mild small vessel ischemic changes. Electronically Signed   By: Julian Hy M.D.   On: 09/05/2021 23:38   NM Pulmonary Perfusion  Result Date: 09/06/2021 CLINICAL DATA:  75 year old male with chest pain and chronic cough. Tachycardic. EXAM: NUCLEAR MEDICINE PERFUSION LUNG SCAN TECHNIQUE: Perfusion images were obtained in multiple projections after intravenous injection of radiopharmaceutical. RADIOPHARMACEUTICALS:  4.1 mCi Tc-66m MAA COMPARISON:  CT chest 09/06/2021 FINDINGS: Single linear defect in the posterior LEFT lower lobe seen on posterior projection. This perfusion defect is favored to correspond atelectasis on comparison CT. No wedge-shaped peripheral perfusion defects to suggest acute pulmonary. IMPRESSION: 1. No evidence acute pulmonary embolism. 2. LEFT basilar atelectasis. Electronically Signed   By: Suzy Bouchard M.D.   On: 09/06/2021 13:17   DG Chest Portable 1 View  Result Date: 09/05/2021 CLINICAL DATA:  Altered level of consciousness, history of prostate cancer EXAM: PORTABLE CHEST 1 VIEW COMPARISON:  02/12/2021 FINDINGS: Single frontal view of  the chest demonstrates an unremarkable cardiac silhouette. No acute airspace disease, effusion, or pneumothorax. No acute bony abnormalities. IMPRESSION: 1. No acute intrathoracic process. Electronically Signed   By: Randa Ngo M.D.   On: 09/05/2021 23:00   ECHOCARDIOGRAM COMPLETE  Result Date: 09/06/2021    ECHOCARDIOGRAM REPORT   Patient Name:    Fernando Blankenship Date of Exam: 09/06/2021 Medical Rec #:  [id number]      Height:       67.0 in Accession #:    [id number]     Weight:       230.0 lb Date of Birth:  1946-06-04      BSA:          2.146 m Patient Age:    75 years       BP:           107/70 mmHg Patient Gender: M              HR:           105 bpm. Exam Location:  Inpatient Procedure: 2D Echo, Cardiac Doppler and Color Doppler Indications:    Syncope  History:        Patient has no prior history of Echocardiogram examinations.                 CAD; Risk Factors:Diabetes and Hypertension.  Sonographer:    Maudry Mayhew MHA, RDMS, RVT, RDCS Referring Phys: [id number] [facility]  Sonographer Comments: Image acquisition challenging due to respiratory motion. IMPRESSIONS  1. Left ventricular ejection fraction, by estimation, is 60 to 65%. The left ventricle has normal function. Left ventricular endocardial border not optimally defined to evaluate regional wall motion. Indeterminate diastolic filling due to E-A fusion.  2. Right ventricular systolic function is normal. The right ventricular size is normal. There is normal pulmonary artery systolic pressure. The estimated right ventricular systolic pressure is 8.4 mmHg.  3. The mitral valve is grossly normal. No evidence of mitral valve regurgitation. No evidence of mitral stenosis.  4. The aortic valve is tricuspid. There is mild calcification of the aortic valve. Aortic valve regurgitation is not visualized. Aortic valve sclerosis is present, with no evidence of aortic valve stenosis.  5. The inferior vena cava is normal in size with greater than 50% respiratory variability, suggesting right atrial pressure of 3 mmHg. FINDINGS  Left Ventricle: Left ventricular ejection fraction, by estimation, is 60 to 65%. The left ventricle has normal function. Left ventricular endocardial border not optimally defined to evaluate regional wall motion. The left ventricular internal cavity size was normal in size.  There is no left ventricular hypertrophy. Indeterminate diastolic filling due to E-A fusion. Right Ventricle: The right ventricular size is normal. No increase in right ventricular wall thickness. Right ventricular systolic function is normal. There is normal pulmonary artery systolic pressure. The tricuspid regurgitant velocity is 1.16 m/s, and  with an assumed right atrial pressure of 3 mmHg, the estimated right ventricular systolic pressure is 8.4 mmHg. Left Atrium: Left atrial size was normal in size. Right Atrium: Right atrial size was normal in size. Pericardium: Trivial pericardial effusion is present. Mitral Valve: The mitral valve is grossly normal. No evidence of mitral valve regurgitation. No evidence of mitral valve stenosis. Tricuspid Valve: The tricuspid valve is grossly normal. Tricuspid valve regurgitation is not demonstrated. No evidence of tricuspid stenosis. Aortic Valve: The aortic valve is tricuspid. There is mild calcification of the aortic valve. Aortic valve regurgitation is not visualized. Aortic  valve sclerosis is present, with no evidence of aortic valve stenosis. Aortic valve mean gradient measures 4.0 mmHg. Aortic valve peak gradient measures 8.1 mmHg. Aortic valve area, by VTI measures 2.38 cm. Pulmonic Valve: The pulmonic valve was grossly normal. Pulmonic valve regurgitation is not visualized. No evidence of pulmonic stenosis. Aorta: The aortic root is normal in size and structure. Venous: The inferior vena cava is normal in size with greater than 50% respiratory variability, suggesting right atrial pressure of 3 mmHg. IAS/Shunts: The atrial septum is grossly normal.  LEFT VENTRICLE PLAX 2D LVIDd:         5.30 cm     Diastology LVIDs:         4.70 cm     LV e' medial:    5.77 cm/s LV PW:         0.70 cm     LV E/e' medial:  13.2 LV IVS:        0.55 cm     LV e' lateral:   5.87 cm/s LVOT diam:     1.80 cm     LV E/e' lateral: 13.0 LV SV:         49 LV SV Index:   23 LVOT Area:      2.54 cm    3D Volume EF                            LV 3D EDV:   152.85 ml                            LV 3D ESV:   34.12 ml LV Volumes (MOD) LV vol d, MOD A2C: 52.1 ml LV vol d, MOD A4C: 72.0 ml LV vol s, MOD A2C: 25.4 ml LV vol s, MOD A4C: 32.3 ml LV SV MOD A2C:     26.7 ml LV SV MOD A4C:     72.0 ml LV SV MOD BP:      34.4 ml RIGHT VENTRICLE RV S prime:     14.10 cm/s TAPSE (M-mode): 1.5 cm LEFT ATRIUM             Index        RIGHT ATRIUM           Index LA diam:        2.80 cm 1.30 cm/m   RA Area:     12.50 cm LA Vol (A2C):   44.9 ml 20.92 ml/m  RA Volume:   27.80 ml  12.95 ml/m LA Vol (A4C):   33.0 ml 15.38 ml/m LA Biplane Vol: 40.8 ml 19.01 ml/m  AORTIC VALVE AV Area (Vmax):    2.20 cm AV Area (Vmean):   2.31 cm AV Area (VTI):     2.38 cm AV Vmax:           142.00 cm/s AV Vmean:          93.100 cm/s AV VTI:            0.206 m AV Peak Grad:      8.1 mmHg AV Mean Grad:      4.0 mmHg LVOT Vmax:         123.00 cm/s LVOT Vmean:        84.500 cm/s LVOT VTI:          0.193 m LVOT/AV VTI ratio: 0.94  AORTA Ao Root diam: 3.00 cm  MITRAL VALVE                TRICUSPID VALVE MV Area (PHT): 2.50 cm     TR Peak grad:   5.4 mmHg MV Decel Time: 304 msec     TR Vmax:        116.00 cm/s MV E velocity: 76.40 cm/s MV A velocity: 107.00 cm/s  SHUNTS MV E/A ratio:  0.71         Systemic VTI:  0.19 m                             Systemic Diam: 1.80 cm Eleonore Chiquito MD Electronically signed by Eleonore Chiquito MD Signature Date/Time: 09/06/2021/1:20:31 PM    Final    VAS US CAROTID  Result Date: 09/06/2021 Carotid Arterial Duplex Study Patient Name:  Fernando Blankenship  Date of Exam:   09/06/2021 Medical Rec #: [id number]       Accession #:    [id number] Date of Birth: Sep 18, 1946       Patient Gender: M Patient Age:   75 years Exam Location:  [facility] Procedure:      VAS US CAROTID Referring Phys: Wandra Feinstein RATHORE --------------------------------------------------------------------------------  Indications:        Syncope. Risk Factors:      Hypertension, Diabetes, past history of smoking, coronary                    artery disease. Comparison Study:  No prior study Performing Technologist: Maudry Mayhew MHA, RDMS, RVT, RDCS  Examination Guidelines: A complete evaluation includes B-mode imaging, spectral Doppler, color Doppler, and power Doppler as needed of all accessible portions of each vessel. Bilateral testing is considered an integral part of a complete examination. Limited examinations for reoccurring indications may be performed as noted.  Right Carotid Findings: +----------+--------+--------+--------+----------------------------+--------+           PSV cm/sEDV cm/sStenosisPlaque Description          Comments +----------+--------+--------+--------+----------------------------+--------+ CCA Prox  83      24                                                   +----------+--------+--------+--------+----------------------------+--------+ CCA Distal80      14              smooth and heterogenous              +----------+--------+--------+--------+----------------------------+--------+ ICA Prox  82      30              smooth and homogeneous               +----------+--------+--------+--------+----------------------------+--------+ ICA Distal113     37                                                   +----------+--------+--------+--------+----------------------------+--------+ ECA       107     21              hyperechoic and heterogenous         +----------+--------+--------+--------+----------------------------+--------+ +----------+--------+-------+----------------+-------------------+           PSV cm/sEDV  cmsDescribe        Arm Pressure (mmHG) +----------+--------+-------+----------------+-------------------+ [id number]             Multiphasic, WNL                    +----------+--------+-------+----------------+-------------------+  +---------+--------+--+--------+--+---------+ VertebralPSV cm/s41EDV cm/s13Antegrade +---------+--------+--+--------+--+---------+  Left Carotid Findings: +----------+-------+--------+--------+--------------------------------+--------+           PSV    EDV cm/sStenosisPlaque Description              Comments           cm/s                                                            +----------+-------+--------+--------+--------------------------------+--------+ CCA Prox  100    24              focal and heterogenous                   +----------+-------+--------+--------+--------------------------------+--------+ CCA Distal101    25                                                       +----------+-------+--------+--------+--------------------------------+--------+ ICA Prox  54     19              focal, hyperechoic and                                                    heterogenous                             +----------+-------+--------+--------+--------------------------------+--------+ ICA Distal84     33                                                       +----------+-------+--------+--------+--------------------------------+--------+ ECA       58     13              heterogenous and hyperechoic             +----------+-------+--------+--------+--------------------------------+--------+ +----------+--------+--------+----------------+-------------------+           PSV cm/sEDV cm/sDescribe        Arm Pressure (mmHG) +----------+--------+--------+----------------+-------------------+ [id number]             Multiphasic, WNL                    +----------+--------+--------+----------------+-------------------+ +---------+--------+--+--------+--+---------+ VertebralPSV cm/s46EDV cm/s15Antegrade +---------+--------+--+--------+--+---------+   Summary: Right Carotid: Velocities in the right ICA are consistent with a 1-39% stenosis.  Left Carotid: Velocities in the left ICA are consistent with a 1-39% stenosis. Vertebrals:  Bilateral vertebral arteries demonstrate antegrade flow. Subclavians: Normal flow hemodynamics were seen in bilateral subclavian              arteries. *  See table(s) above for measurements and observations.  Electronically signed by Harold Barban MD on 09/06/2021 at 11:46:53 PM.    Final    VAS [place] LOWER EXTREMITY VENOUS (DVT)  Result Date: 09/06/2021  Lower Venous DVT Study Patient Name:  Fernando Blankenship  Date of Exam:   09/06/2021 Medical Rec #: [id number]       Accession #:    [id number] Date of Birth: [birth year]       Patient Gender: M Patient Age:   20 years Exam Location:  [facility] Procedure:      VAS [place] LOWER EXTREMITY VENOUS (DVT) Referring Phys: Wandra Feinstein RATHORE --------------------------------------------------------------------------------  Indications: Edema.  Comparison Study: No prior study Performing Technologist: Maudry Mayhew MHA, RDMS, RVT, RDCS  Examination Guidelines: A complete evaluation includes B-mode imaging, spectral Doppler, color Doppler, and power Doppler as needed of all accessible portions of each vessel. Bilateral testing is considered an integral part of a complete examination. Limited examinations for reoccurring indications may be performed as noted. The reflux portion of the exam is performed with the patient in reverse Trendelenburg.  +---------+---------------+---------+-----------+----------+--------------+ RIGHT    CompressibilityPhasicitySpontaneityPropertiesThrombus Aging +---------+---------------+---------+-----------+----------+--------------+ CFV      Full           Yes      Yes                                 +---------+---------------+---------+-----------+----------+--------------+ SFJ      Full                                                        +---------+---------------+---------+-----------+----------+--------------+ FV Prox  Full                                                         +---------+---------------+---------+-----------+----------+--------------+ FV Mid   Full                                                        +---------+---------------+---------+-----------+----------+--------------+ FV DistalFull                                                        +---------+---------------+---------+-----------+----------+--------------+ PFV      Full                                                        +---------+---------------+---------+-----------+----------+--------------+ POP      Full           Yes      Yes                                 +---------+---------------+---------+-----------+----------+--------------+  PTV      Full                                                        +---------+---------------+---------+-----------+----------+--------------+ PERO     None                    No                   Acute          +---------+---------------+---------+-----------+----------+--------------+   +---------+---------------+---------+-----------+----------+--------------+ LEFT     CompressibilityPhasicitySpontaneityPropertiesThrombus Aging +---------+---------------+---------+-----------+----------+--------------+ CFV      Full           Yes      Yes                                 +---------+---------------+---------+-----------+----------+--------------+ SFJ      Full                                                        +---------+---------------+---------+-----------+----------+--------------+ FV Prox  Full                                                        +---------+---------------+---------+-----------+----------+--------------+ FV Mid   Full                                                        +---------+---------------+---------+-----------+----------+--------------+ FV DistalFull                                                         +---------+---------------+---------+-----------+----------+--------------+ PFV      Full                                                        +---------+---------------+---------+-----------+----------+--------------+ POP      Full           Yes      Yes                                 +---------+---------------+---------+-----------+----------+--------------+ PTV      Full                                                        +---------+---------------+---------+-----------+----------+--------------+  PERO     Full                                                        +---------+---------------+---------+-----------+----------+--------------+     Summary: RIGHT: - Findings consistent with acute deep vein thrombosis involving the right peroneal veins. - No cystic structure found in the popliteal fossa.  LEFT: - There is no evidence of deep vein thrombosis in the lower extremity.  - No cystic structure found in the popliteal fossa.  *See table(s) above for measurements and observations. Electronically signed by Harold Barban MD on 09/06/2021 at 7:47:09 PM.    Final    CT CHEST ABDOMEN PELVIS WO CONTRAST  Result Date: 09/06/2021 CLINICAL DATA:  Found down, altered mental status, abdominal pain EXAM: CT CHEST, ABDOMEN AND PELVIS WITHOUT CONTRAST TECHNIQUE: Multidetector CT imaging of the chest, abdomen and pelvis was performed following the standard protocol without IV contrast. COMPARISON:  Chest radiograph dated 09/05/2021 FINDINGS: CT CHEST FINDINGS Cardiovascular: The heart is normal in size. No pericardial effusion. No evidence of thoracic aortic aneurysm. Atherosclerotic calcifications of the arch. Coronary atherosclerosis of the LAD. Mediastinum/Nodes: No suspicious mediastinal lymphadenopathy. Visualized thyroid is unremarkable. Lungs/Pleura: Mild paraseptal emphysematous changes, upper lung predominant. No suspicious pulmonary nodules. Mild dependent atelectasis  in the bilateral lower lobes. No focal consolidation. No pleural effusion or pneumothorax. Musculoskeletal: Degenerative changes of the thoracic spine. CT ABDOMEN PELVIS FINDINGS Hepatobiliary: Unenhanced liver is unremarkable, noting focal fat along the gallbladder fossa. Gallbladder is unremarkable. No intrahepatic or extrahepatic ductal dilatation. Pancreas: Within normal limits. Spleen: Within normal limits. Adrenals/Urinary Tract: Mild nodular thickening of the right adrenal gland. 14 mm left adrenal nodule (series 3/image 63), indeterminate but likely reflecting a benign adrenal adenoma. Kidneys are within normal limits, noting nonspecific perinephric stranding. No renal, ureteral, or bladder calculi. No hydronephrosis. Thick-walled bladder, correlate for cystitis. Stomach/Bowel: Stomach is within normal limits. No evidence of bowel obstruction. Normal appendix (series 3/image 95). Left colonic diverticulosis, without evidence of diverticulitis. Vascular/Lymphatic: No evidence of abdominal aortic aneurysm. Atherosclerotic calcifications of the abdominal aorta and branch vessels. No suspicious abdominopelvic lymphadenopathy. Reproductive: Brachytherapy seeds along the prostate. Other: Mild complex fluid/hemorrhage along the left pericolic gutter (series 3/image 79). Musculoskeletal: Mild degenerative changes of the lumbar spine. IMPRESSION: Thick-walled bladder, correlate for cystitis. Brachytherapy seeds along the prostate. No findings suspicious for metastatic disease. Mild complex fluid/hemorrhage along the left pericolic gutter, nonspecific. No acute cardiopulmonary abnormality. Electronically Signed   By: Julian Hy M.D.   On: 09/06/2021 02:47    Anti-infectives: Anti-infectives (From admission, onward)    Start     Dose/Rate Route Frequency Ordered Stop   09/07/21 2200  ceFEPIme (MAXIPIME) 2 g in sodium chloride 0.9 % 100 mL IVPB        2 g 200 mL/hr over 30 Minutes Intravenous Every 24  hours 09/06/21 0104 09/14/21 2159   09/06/21 1435  vancomycin variable dose per unstable renal function (pharmacist dosing)  Status:  Discontinued         Does not apply See admin instructions 09/06/21 1435 09/06/21 1443   09/05/21 2330  vancomycin (VANCOREADY) IVPB 2000 mg/400 mL        2,000 mg 200 mL/hr over 120 Minutes Intravenous  Once 09/05/21 2318 09/06/21 0227   09/05/21 2315  ceFEPIme (MAXIPIME) 2  g in sodium chloride 0.9 % 100 mL IVPB        2 g 200 mL/hr over 30 Minutes Intravenous  Once 09/05/21 2312 09/06/21 0004   09/05/21 2315  metroNIDAZOLE (FLAGYL) IVPB 500 mg        500 mg 100 mL/hr over 60 Minutes Intravenous  Once 09/05/21 2312 09/06/21 0023   09/05/21 2315  vancomycin (VANCOCIN) IVPB 1000 mg/200 mL premix  Status:  Discontinued        1,000 mg 200 mL/hr over 60 Minutes Intravenous  Once 09/05/21 2312 09/05/21 2318        Assessment/Plan Free fluid in L paracolic gutter, unclear etiology -no abdominal pain -hgb relatively stable so doubt solid organ injury -doubt perforated viscus given he is tolerating a solid diet, normal WBC, and no abdominal pain -no further surgical intervention needed.  We will sign off and defer further care to primary teams  FEN - HH diet VTE - heparin ID - Maxipime   LOS: 1 day    Henreitta Cea , [facility] 09/07/2021, 9:50 AM Please see Amion for pager number during day hours 7:00am-4:30pm or 7:00am -11:30am on weekends

## 2021-09-08 ENCOUNTER — Other Ambulatory Visit (HOSPITAL_COMMUNITY): Payer: Self-pay

## 2021-09-08 DIAGNOSIS — A419 Sepsis, unspecified organism: Secondary | ICD-10-CM | POA: Diagnosis not present

## 2021-09-08 LAB — COMPREHENSIVE METABOLIC PANEL
ALT: 48 U/L — ABNORMAL HIGH (ref 0–44)
AST: 68 U/L — ABNORMAL HIGH (ref 15–41)
Albumin: 2.1 g/dL — ABNORMAL LOW (ref 3.5–5.0)
Alkaline Phosphatase: 41 U/L (ref 38–126)
Anion gap: 7 (ref 5–15)
BUN: 39 mg/dL — ABNORMAL HIGH (ref 8–23)
CO2: 26 mmol/L (ref 22–32)
Calcium: 7.7 mg/dL — ABNORMAL LOW (ref 8.9–10.3)
Chloride: 103 mmol/L (ref 98–111)
Creatinine, Ser: 1.47 mg/dL — ABNORMAL HIGH (ref 0.61–1.24)
GFR, Estimated: 49 mL/min — ABNORMAL LOW (ref >60)
Glucose, Bld: 162 mg/dL — ABNORMAL HIGH (ref 70–99)
Potassium: 3.3 mmol/L — ABNORMAL LOW (ref 3.5–5.1)
Sodium: 136 mmol/L (ref 135–145)
Total Bilirubin: 0.8 mg/dL (ref 0.3–1.2)
Total Protein: 5.1 g/dL — ABNORMAL LOW (ref 6.5–8.1)

## 2021-09-08 LAB — CBC
HCT: 31.1 % — ABNORMAL LOW (ref 39.0–52.0)
Hemoglobin: 10.3 g/dL — ABNORMAL LOW (ref 13.0–17.0)
MCH: 30 pg (ref 26.0–34.0)
MCHC: 33.1 g/dL (ref 30.0–36.0)
MCV: 90.7 fL (ref 80.0–100.0)
Platelets: 137 10*3/uL — ABNORMAL LOW (ref 150–400)
RBC: 3.43 MIL/uL — ABNORMAL LOW (ref 4.22–5.81)
RDW: 16.1 % — ABNORMAL HIGH (ref 11.5–15.5)
WBC: 8.2 10*3/uL (ref 4.0–10.5)
nRBC: 0 % (ref 0.0–0.2)

## 2021-09-08 LAB — GLUCOSE, CAPILLARY
Glucose-Capillary: 213 mg/dL — ABNORMAL HIGH (ref 70–99)
Glucose-Capillary: 172 mg/dL — ABNORMAL HIGH (ref 70–99)
Glucose-Capillary: 161 mg/dL — ABNORMAL HIGH (ref 70–99)
Glucose-Capillary: 206 mg/dL — ABNORMAL HIGH (ref 70–99)

## 2021-09-08 LAB — CK: Total CK: 839 U/L — ABNORMAL HIGH (ref 49–397)

## 2021-09-08 LAB — HEPARIN LEVEL (UNFRACTIONATED): Heparin Unfractionated: 0.44 IU/mL (ref 0.30–0.70)

## 2021-09-08 MED ORDER — SODIUM CHLORIDE 0.9 % IV SOLN
2.0000 g | Freq: Two times a day (BID) | INTRAVENOUS | Status: DC
  Administered 2021-09-08 – 2021-09-12 (×8): 2 g via INTRAVENOUS
  Filled 2021-09-08 (×8): qty 2

## 2021-09-08 MED ORDER — POTASSIUM CHLORIDE CRYS ER 20 MEQ PO TBCR
40.0000 meq | EXTENDED_RELEASE_TABLET | Freq: Once | ORAL | Status: AC
  Administered 2021-09-08: 40 meq via ORAL
  Filled 2021-09-08: qty 2

## 2021-09-08 NOTE — Evaluation (Signed)
Occupational Therapy Evaluation Patient Details Name: Fernando Blankenship MRN: [id number] DOB: Nov 09, 1945 Today's Date: 09/08/2021   History of Present Illness Pt is a 75 y.o. M who presents 09/05/2021 with AMS after being found down at home. Admitted with complicated UTI, free fluid in left paracolic gutter, RLE DVT, AKI, hyperkalemia, mild rhabdomyolysis. Significant PMH: CAD s/p PCI, HTN, DM2, prostate CA s/p radioactive seed implant in august 2022.   Clinical Impression   Pt presents with decline in function and safety with ADLs and ADL mobility with impaired strength, balance, endurance and cognition; pt presenting with decreased orientation, problem solving, awareness, and memory. Per pt report, he lives alone and was Ind with ADLs/selfcare, cooking, used a cane for mobility was driving and working. Pt currently requires set up/Sup with UB selfcare, mod A with LB selfcare, mod A with toileting tasks and min A +2 for safety with mobility using RW. Pt would benefit from acute OT services to address impairments to maximize level of function and safety     Recommendations for follow up therapy are one component of a multi-disciplinary discharge planning process, led by the attending physician.  Recommendations may be updated based on patient status, additional functional criteria and insurance authorization.   Follow Up Recommendations  Acute inpatient rehab (3hours/day)    Assistance Recommended at Discharge Frequent or constant Supervision/Assistance  Functional Status Assessment  Patient has had a recent decline in their functional status and demonstrates the ability to make significant improvements in function in a reasonable and predictable amount of time.  Equipment Recommendations  Other (comment) (RW)    Recommendations for Other Services Rehab consult     Precautions / Restrictions Precautions Precautions: Fall Restrictions Weight Bearing Restrictions: No      Mobility Bed  Mobility Overal bed mobility: Needs Assistance Bed Mobility: Supine to Sit     Supine to sit: Supervision     General bed mobility comments: cues for initiation    Transfers Overall transfer level: Needs assistance Equipment used: Rolling walker (2 wheels) Transfers: Sit to/from Stand Sit to Stand: Min assist;+2 safety/equipment           General transfer comment: Pt initially stood with no UE support from edge of bed, demonstrated posterior lean, with decreased eccentric control back to edge of bed. Then trialed RW, cues provided for hand placement. Overall, min A to power up and steady, mod verbal cues for safety overall      Balance Overall balance assessment: Needs assistance Sitting-balance support: Feet supported Sitting balance-Leahy Scale: Good Sitting balance - Comments: Able to don socks edge of bed with increased time and supervision   Standing balance support: No upper extremity supported;During functional activity Standing balance-Leahy Scale: Poor Standing balance comment: reliant on external support                           ADL either performed or assessed with clinical judgement   ADL Overall ADL's : Needs assistance/impaired Eating/Feeding: Independent   Grooming: Wash/dry hands;Wash/dry face;Min guard;Standing   Upper Body Bathing: Set up;Supervision/ safety;Sitting   Lower Body Bathing: Minimal assistance Lower Body Bathing Details (indicate cue type and reason): simulated seated EOB Upper Body Dressing : Set up;Supervision/safety;Sitting     Lower Body Dressing Details (indicate cue type and reason): Sup to don socks seated EOB with increased time required to complete. Pt hyperverbose and easily distracted Toilet Transfer: Minimal assistance;+2 for safety/equipment;Ambulation;Rolling walker (2 wheels);Cueing for safety;Cueing for  sequencing;BSC/3in1;Grab bars   Toileting- Clothing Manipulation and Hygiene: Sit to/from stand;Moderate  assistance Toileting - Clothing Manipulation Details (indicate cue type and reason): mod A with clothing mgt     Functional mobility during ADLs: Minimal assistance;+2 for safety/equipment;Rolling walker (2 wheels);Cueing for safety;Cueing for sequencing       Vision Baseline Vision/History: 1 Wears glasses Ability to See in Adequate Light: 0 Adequate Patient Visual Report: No change from baseline       Perception     Praxis      Pertinent Vitals/Pain Pain Assessment: No/denies pain     Hand Dominance Right   Extremity/Trunk Assessment Upper Extremity Assessment Upper Extremity Assessment: RUE deficits/detail;LUE deficits/detail RUE Deficits / Details: 3+5/ grossly LUE Deficits / Details: 3+/5 grossly   Lower Extremity Assessment Lower Extremity Assessment: Defer to PT evaluation RLE Deficits / Details: Grossly 3-/5 RLE Sensation: history of peripheral neuropathy LLE Deficits / Details: Grossly 3-/5 LLE Sensation: history of peripheral neuropathy   Cervical / Trunk Assessment Cervical / Trunk Assessment: Normal   Communication Communication Communication: No difficulties   Cognition Arousal/Alertness: Awake/alert Behavior During Therapy: WFL for tasks assessed/performed Overall Cognitive Status: Impaired/Different from baseline Area of Impairment: Orientation;Memory;Following commands;Safety/judgement;Awareness;Problem solving                 Orientation Level: Disoriented to;Place   Memory: Decreased short-term memory Following Commands: Follows one step commands inconsistently Safety/Judgement: Decreased awareness of safety;Decreased awareness of deficits Awareness: Intellectual Problem Solving: Difficulty sequencing;Requires verbal cues General Comments: Pt initially oriented to place, stating he was at [facility], but later stating he is at a hospital off "[address]," which he later to corrected to "[address];" pt thought he was at [facility] after further  questioning. Pt with poor problem solving abilities, often requiring repetitive multimodal cues to execute task and wayfind in hallway. Decreased STM. Does not recall events leading to hospitalization     General Comments       Exercises     Shoulder Instructions      Home Living Family/patient expects to be discharged to:: Private residence Living Arrangements: Alone Available Help at Discharge: Family Type of Home: House Home Access: Stairs to enter [employer] of Steps: 2   Home Layout: Two level;Able to live on main level with bedroom/bathroom;1/2 bath on main level Alternate Level Stairs-Number of Steps: flight   Bathroom Shower/Tub: Teacher, early years/pre: Standard     Home Equipment: Grab bars - tub/shower;Cane - single point;Shower seat   Additional Comments: Pt report his children are on the way from [place]      Prior Functioning/Environment Prior Level of Function : Independent/Modified Independent             Mobility Comments: uses cane sometimes when he feels unblanced per pt report ADLs Comments: pt reports that he was Ind with  ADLs, home mgt, cooking and was driving and working        OT Problem List: Decreased strength;Impaired balance (sitting and/or standing);Decreased cognition;Decreased safety awareness;Decreased activity tolerance;Decreased knowledge of use of DME or AE      OT Treatment/Interventions: Self-care/ADL training;Patient/family education;Balance training;Therapeutic activities;DME and/or AE instruction    OT Goals(Current goals can be found in the care plan section) Acute Rehab OT Goals Patient Stated Goal: none stated OT Goal Formulation: With patient Time For Goal Achievement: 09/22/21 Potential to Achieve Goals: Good ADL Goals Pt Will Perform Grooming: with supervision;with set-up;standing Pt Will Perform Lower Body Bathing: with min assist;with min  guard assist;sitting/lateral leans;sit to/from  stand Pt Will Perform Lower Body Dressing: with min assist;with min guard assist;sitting/lateral leans;sit to/from stand Pt Will Transfer to Toilet: with min assist;with min guard assist;ambulating Pt Will Perform Toileting - Clothing Manipulation and hygiene: with min assist;with min guard assist;sit to/from stand  OT Frequency: Min 2X/week   Barriers to D/C:            Co-evaluation PT/OT/SLP Co-Evaluation/Treatment: Yes Reason for Co-Treatment: For patient/therapist safety;To address functional/ADL transfers PT goals addressed during session: Mobility/safety with mobility OT goals addressed during session: ADL's and self-care;Proper use of Adaptive equipment and DME      AM-PAC OT "6 Clicks" Daily Activity     Outcome Measure Help from another person eating meals?: None Help from another person taking care of personal grooming?: A Little Help from another person toileting, which includes using toliet, bedpan, or urinal?: A Lot Help from another person bathing (including washing, rinsing, drying)?: A Lot Help from another person to put on and taking off regular upper body clothing?: A Little Help from another person to put on and taking off regular lower body clothing?: A Lot 6 Click Score: 16   End of Session Equipment Utilized During Treatment: Gait belt;Rolling walker (2 wheels);Other (comment) (3 in 1 over toilet)  Activity Tolerance: Patient tolerated treatment well Patient left: in chair;with call bell/phone within reach;with chair alarm set  OT Visit Diagnosis: Unsteadiness on feet (R26.81);Other abnormalities of gait and mobility (R26.89);History of falling (Z91.81);Muscle weakness (generalized) (M62.81);Other symptoms and signs involving cognitive function                Time: 0933-1010 OT Time Calculation (min): 37 min Charges:  OT General Charges $OT Visit: 1 Visit OT Evaluation $OT Eval Moderate Complexity: 1 Mod    Britt Bottom 09/08/2021, 1:32  PM

## 2021-09-08 NOTE — TOC Benefit Eligibility Note (Signed)
Patient Teacher, English as a foreign language completed.    The patient is currently admitted and upon discharge could be taking Eliquis 5 mg.  The current 30 day co-pay is, $30.00.   The patient is insured through [place] Patient Advocate Specialist [place] Patient Advocate Team Direct Number: [phone]  Fax: [phone]

## 2021-09-08 NOTE — Progress Notes (Signed)
ANTICOAGULATION CONSULT NOTE - Follow Up Consult  Pharmacy Consult for Heparin Indication: RLE DVT  Allergies  Allergen Reactions   Prozac [Fluoxetine] Anaphylaxis   Other Other (See Comments)    Plavix cause his lips to swell    Patient Measurements: Height: 5\' 7"  (170.2 cm) Weight: 104.3 kg (230 lb) IBW/kg (Calculated) : 66.1 Heparin Dosing Weight: 89 kg  Vital Signs: Temp: 98.4 F (36.9 C) (12/13 0910) Temp Source: Oral (12/13 0910) BP: 115/69 (12/13 0910) Pulse Rate: 85 (12/13 0910)  Labs: Recent Labs    09/05/21 2225 09/06/21 0025 09/06/21 0239 09/06/21 1441 09/07/21 0349 09/07/21 1008 09/08/21 0552  HGB 14.4  --    < > 12.5* 10.2*  --  10.3*  HCT 44.3  --    < > 38.5* 32.1*  --  31.1*  PLT 160  --   --  152 138*  --  137*  HEPARINUNFRC  --   --    < > 0.18* 0.37 0.35 0.44  CREATININE 3.43*  --    < > 2.42* 2.17*  --  1.47*  CKTOTAL >4,100*  --   --  5,530* 2,625*  --  839*  TROPONINIHS 46* 44*  --   --   --   --   --    < > = values in this interval not displayed.     Estimated Creatinine Clearance: 50 mL/min (A) (by C-G formula based on SCr of 1.47 mg/dL (H)).  Assessment:  75 yr old male on IV heparin for RLE DVT.  VQ scan negative for PE.   Heparin level remains therapeutic (0.44) on 1800 units/hr.  CBC trended down some, possibly dilutional. Rhabdomyolysis, creatinine and LFTs trending down. Platelet count 160>>138>137, about 20% drop. No bleeding reported.  Goal of Therapy:  Heparin level 0.3-0.7 units/ml Monitor platelets by anticoagulation protocol: Yes   Plan:  Continue heparin drip at 1800 units/hr. Next heparin level and CBC in am. Monitor for bleeding. Note plan to change to Eliquis soon.  Arty Baumgartner, [place] 09/08/2021,11:22 AM

## 2021-09-08 NOTE — Plan of Care (Signed)
  Problem: Education: Goal: Knowledge of General Education information will improve Description: Including pain rating scale, medication(s)/side effects and non-pharmacologic comfort measures Outcome: Progressing   Problem: Clinical Measurements: Goal: Cardiovascular complication will be avoided Outcome: Progressing   Problem: Activity: Goal: Risk for activity intolerance will decrease Outcome: Progressing   Problem: Nutrition: Goal: Adequate nutrition will be maintained Outcome: Progressing   Problem: Coping: Goal: Level of anxiety will decrease Outcome: Progressing   Problem: Elimination: Goal: Will not experience complications related to bowel motility Outcome: Progressing   Problem: Pain Managment: Goal: General experience of comfort will improve Outcome: Progressing   Problem: Safety: Goal: Ability to remain free from injury will improve Outcome: Progressing   Problem: Skin Integrity: Goal: Risk for impaired skin integrity will decrease Outcome: Progressing

## 2021-09-08 NOTE — Progress Notes (Signed)
Pharmacy Antibiotic Note  Fernando Blankenship is a 75 y.o. male on day # 3 Cefepime for fever with unknown source/ sepsis coverage. Urine culture now growing Pseudomonas and Staph epidermidis. Sensitivities pending.      Rhabdo, AKI improving.  Plan:  Cefepime 2gm IV adjusted q24 > q12hrs.  Follow renal function for any need to further adjust.  Will follow up for final culture report and antibiotic plans.  Height: 5\' 7"  (170.2 cm) Weight: 104.3 kg (230 lb) IBW/kg (Calculated) : 66.1  Temp (24hrs), Avg:98.3 F (36.8 C), Min:97.9 F (36.6 C), Max:98.7 F (37.1 C)  Recent Labs  Lab 09/05/21 2225 09/06/21 0022 09/06/21 0349 09/06/21 1441 09/07/21 0349 09/08/21 0552  WBC 17.0*  --   --  12.9* 9.4 8.2  CREATININE 3.43*  --  3.02* 2.42* 2.17* 1.47*  LATICACIDVEN  --  1.9  --   --   --   --     Estimated Creatinine Clearance: 50 mL/min (A) (by C-G formula based on SCr of 1.47 mg/dL (H)).    Allergies  Allergen Reactions   Prozac [Fluoxetine] Anaphylaxis   Other Other (See Comments)    Plavix cause his lips to swell    Antimicrobials this admission:  Cefepime 12/10 (2321) >>  Flagyl 12/10 x 1 (2319)  Vanc 12/11 x 1 (0012)  Dose adjustments this admission: 12/13: creatinine 3.43>>1.47:  Cefepime 2gm IV q24h > q12h  Microbiology results: 12/10 Blood: no growth x 3 days to date 12/11 urine: reincubated 12/12, now with >100 K/ml Staph epi and Pseudomonas, sensitivities pending 12/10 COVID and flu: neg  Thank you for allowing pharmacy to be a part of this patients care.  Arty Baumgartner, [place] 09/08/2021 4:25 PM

## 2021-09-08 NOTE — Progress Notes (Signed)
PROGRESS NOTE    Fernando Blankenship  FWY:[id number] DOB: [birth year] DOA: 09/05/2021 PCP: Lavone Orn, MD   Chief Complaint  Patient presents with   Altered Mental Status  Brief Narrative/Hospital Course: Fernando Blankenship, 75 y.o. male with PMH of  CAD status post PCI in 2014, hypertension, hyperlipidemia, GERD, non-insulin-dependent type 2 diabetes, prostate cancer status post radioactive seed implant in August 2022, RLS, BPH presented to the ED via EMS for evaluation of altered mental status.  Patient lives by himself was seen 2 to 3 days prior to admission, was not answering call and had to break into residents and was found down on the ground incontinent of urine and feces, was confused delirious and hypoxic on room air placed on oxygen and brought to the ED  In the ED, hypoxic to 80s on room air, lab with leukocytosis 17 K, hemoglobin 14.4 and dropped to 12.6 after fluid boluses, mild hyperkalemia, was also febrile 103.5 tachycardic, tachypneic.  Chest x-ray no pneumonia CT head no acute finding,CT scan abdomen with features of cystitis, nonspecific fluid collection in the left pericolic gutter suspicious for hemorrhage, EDP consulted the trauma surgery, patient was also placed on bicarb infusion and heparin drip as pulmonary embolism is being ruled out.  Chest x-ray unremarkable.  Subjective: Aaox3,no new complaints Afebrile overnight, on room air Labs with potassium 3.3, improving renal function and CK level Needed foley last night for retention. Hiccups on and off He is on the phone w/ speaker with his daughter  Assessment & Plan:  Severe sepsis POA Complicated UTI: At baseline does intermittent self catheterization at home since his prostate cancer treatment Patient is clinically improving, hemodynamically stable, no more fever.  Leukocytosis has resolved.  Blood culture no growth so far, urine culture with  staph epidermidis  and Pseudomonas . Continue with cefepime.  I will check  with ID on-call Dr. Candiss Norse re: antibiotic recommendations. Recent Labs  Lab 09/05/21 2225 09/06/21 0022 09/06/21 1441 09/07/21 0349 09/08/21 0552  WBC 17.0*  --  12.9* 9.4 8.2  LATICACIDVEN  --  1.9  --   --   --     Acute metabolic encephalopathy due to sepsis UTI: Mental status is much improved today he is alert awake oriented x3.   Free fluid in left paracolic gutter,unclear etiology: Patient has no abdominal pain or complaint hemoglobin has been notably stable so doubt solid organ injury per trauma surgery.  Doubt perforated viscus no further surgical intervention and have signed off.  Patient is tolerating diet  Right lower extremity DVT, negative VQ scan.  Continue on heparin drip pharmacy dosing monitor hemoglobin count-transition to Eliquis soon  BPH Prostate cancer with radioactive seed implanted in August 2022 Dr Dahlstead/Dr Tammi Klippel. 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 Continue home Flomax oxybutynin and Solifenacin.  Once infection improves will discontinue catheter and resume his home intermittent catheterization  AKI metabolic acidosis Hyperkalemia on presentation: Renal function and rhabdomyolysis is improving we will keep on IV fluid hydration, replete potassium.  Monitor labs Recent Labs  Lab 09/05/21 2225 09/06/21 0349 09/06/21 1441 09/07/21 0349 09/08/21 0552  BUN 51* 47* 47* 46* 39*  CREATININE 3.43* 3.02* 2.42* 2.17* 1.47*     Mild hypokalemia replete orally  Mild rhabdomyolysis was found on the floor, CK improving trend lab continue hydration  Mild transaminitis likely from rhabdomyolysis and sepsis.  Trend.  CAD status post PCI in 2014 Positive troponin 46-44 flat  no delta likely demand ischemia in the setting of sepsis.  Denies any chest pain.  Continue aspirin, metoprolol  and continue to hold statin due to rhabdomyolysis.    GERD continue PPI RLS  continues Requip  Diabetes mellitus blood sugars are stable, continue holding metformin and Jardiance.  Keep on SSI.  A1c stable 6.6 Recent Labs  Lab 09/07/21 0640 09/07/21 1131 09/07/21 1629 09/07/21 2147 09/08/21 0854  GLUCAP 160* 237* 172* 197* 213*    Class II Obesity:Patient's Body mass index is 36.02 kg/m. : Will benefit with PCP follow-up, weight loss  healthy lifestyle and outpatient sleep evaluation.  Deconditioning found on down on the floor: Continue PT OT evaluation will likely need skilled nursing facility placement  DVT prophylaxis: heparin  Code Status:   Code Status: Full Code Family Communication: plan of care discussed with patient and daughter  on phone  again today she is traveling from [place] and will be arriving 12/14.   Status is: Inpatient Remains inpatient appropriate because: Due to ongoing management of his renal failure DVT and confusion Disposition: Currently not medically stable for discharge. Anticipated Disposition: SNF anticipated  Objective: Vitals last 24 hrs: Vitals:   09/07/21 1005 09/07/21 1811 09/07/21 2045 09/08/21 0503  BP: 101/72 130/80 108/64 118/66  Pulse: 86 86 88 83  Resp: 18 20 18 16   Temp: 98.3 F (36.8 C) 97.9 F (36.6 C) 98.7 F (37.1 C) 98 F (36.7 C)  TempSrc:  Oral Oral Oral  SpO2: 96% 96% 94% 96%  Weight:      Height:       Weight change:   Intake/Output Summary (Last 24 hours) at 09/08/2021 0857 Last data filed at 09/08/2021 0800 Gross per 24 hour  Intake 3679.82 ml  Output 4951 ml  Net -1271.18 ml   Net IO Since Admission: 2,944.37 mL [09/08/21 0857]   Physical Examination: General exam: AAOx 3 older than stated age, weak appearing. HEENT:Oral mucosa moist, Ear/Nose WNL grossly, dentition normal. Respiratory system: bilaterally diminished,  no use of accessory muscle Cardiovascular system: S1 & S2 +, No JVD,. Gastrointestinal system: Abdomen soft,NT,ND, BS+ Nervous System:Alert, awake, moving  extremities and grossly nonfocal Extremities: no edema, distal peripheral pulses palpable.  Skin: No rashes,no icterus. MSK: Normal muscle bulk,tone, power .  Medications reviewed:  Scheduled Meds:  aspirin  81 mg Oral Daily   Chlorhexidine Gluconate Cloth  6 each Topical Daily   darifenacin  7.5 mg Oral Daily   insulin aspart  0-5 Units Subcutaneous QHS   insulin aspart  0-9 Units Subcutaneous TID WC   metoprolol succinate  25 mg Oral Daily   multivitamin with minerals  1 tablet Oral Daily   pantoprazole  40 mg Oral Daily   potassium chloride  40 mEq Oral Once   rOPINIRole  1 mg Oral QHS   tamsulosin  0.4 mg Oral BID   Continuous Infusions:  sodium chloride 75 mL/hr at 09/08/21 0029   ceFEPime (MAXIPIME) IV Stopped (09/07/21 1244)   heparin 1,800 Units/hr (09/08/21 0028)   Diet Order             Diet heart healthy/carb modified Room service appropriate? Yes; Fluid consistency: Thin  Diet effective now                          Weight change:   Wt Readings from Last 3 Encounters:  09/05/21 104.3 kg  07/01/21 104.3 kg  05/21/21 112.8 kg  Consultants:see note  Procedures:see note Antimicrobials: Anti-infectives (From admission, onward)    Start     Dose/Rate Route Frequency Ordered Stop   09/07/21 1200  ceFEPIme (MAXIPIME) 2 g in sodium chloride 0.9 % 100 mL IVPB        2 g 200 mL/hr over 30 Minutes Intravenous Every 24 hours 09/06/21 0104 09/14/21 1159   09/06/21 1435  vancomycin variable dose per unstable renal function (pharmacist dosing)  Status:  Discontinued         Does not apply See admin instructions 09/06/21 1435 09/06/21 1443   09/05/21 2330  vancomycin (VANCOREADY) IVPB 2000 mg/400 mL        2,000 mg 200 mL/hr over 120 Minutes Intravenous  Once 09/05/21 2318 09/06/21 0227   09/05/21 2315  ceFEPIme (MAXIPIME) 2 g in sodium chloride 0.9 % 100 mL IVPB        2 g 200 mL/hr over 30 Minutes Intravenous  Once 09/05/21 2312 09/06/21 0004   09/05/21  2315  metroNIDAZOLE (FLAGYL) IVPB 500 mg        500 mg 100 mL/hr over 60 Minutes Intravenous  Once 09/05/21 2312 09/06/21 0023   09/05/21 2315  vancomycin (VANCOCIN) IVPB 1000 mg/200 mL premix  Status:  Discontinued        1,000 mg 200 mL/hr over 60 Minutes Intravenous  Once 09/05/21 2312 09/05/21 2318      Culture/Microbiology    Component Value Date/Time   SDES URINE, CLEAN CATCH 09/06/2021 0428   SPECREQUEST NONE 09/06/2021 0428   CULT (A) 09/06/2021 0428    >=100,000 COLONIES/mL STAPHYLOCOCCUS EPIDERMIDIS >=100,000 COLONIES/mL PSEUDOMONAS AERUGINOSA SUSCEPTIBILITIES TO FOLLOW repeating Performed at [facility], [address] [place],  [address]    REPTSTATUS PENDING 09/06/2021 9678    Other culture-see note  Unresulted Labs (From admission, onward)     Start     Ordered   09/08/21 0500  Heparin level (unfractionated)  Daily,   R      09/06/21 1710   09/08/21 0500  CK  Daily,   R      09/07/21 0800   09/08/21 0500  Comprehensive metabolic panel  Daily,   R      09/07/21 1311   09/07/21 0500  CBC  Daily,   R      09/06/21 0354   09/06/21 1313  MRSA Next Gen by PCR, Nasal  Once,   R        09/06/21 1312          Data Reviewed: I have personally reviewed following labs and imaging studies CBC: Recent Labs  Lab 09/05/21 2225 09/06/21 0239 09/06/21 1441 09/07/21 0349 09/08/21 0552  WBC 17.0*  --  12.9* 9.4 8.2  NEUTROABS 15.0*  --   --   --   --   HGB 14.4 12.6* 12.5* 10.2* 10.3*  HCT 44.3 37.0* 38.5* 32.1* 31.1*  MCV 92.1  --  93.2 93.3 90.7  PLT 160  --  152 138* 938*   Basic Metabolic Panel: Recent Labs  Lab 09/05/21 2225 09/06/21 0239 09/06/21 0349 09/06/21 1441 09/07/21 0349 09/08/21 0552  NA 134* 135 132* 134* 134* 136  K 5.3* 3.6 3.9 3.4* 3.3* 3.3*  CL 99  --  97* 97* 98 103  CO2 18*  --  24 27 26 26   GLUCOSE 285*  --  228* 165* 184* 162*  BUN 51*  --  47* 47* 46* 39*  CREATININE 3.43*  --  3.02* 2.42* 2.17* 1.47*  CALCIUM  8.6*  --  7.9* 8.2* 7.9* 7.7*   GFR: Estimated Creatinine Clearance: 50 mL/min (A) (by C-G formula based on SCr of 1.47 mg/dL (H)). Liver Function Tests: Recent Labs  Lab 09/05/21 2225 09/06/21 1441 09/07/21 0349 09/08/21 0552  AST 164* 147* 101* 68*  ALT 48* 50* 47* 48*  ALKPHOS 45 62 39 41  BILITOT 3.3* 2.2* 1.5* 0.8  PROT 6.6 5.7* 5.2* 5.1*  ALBUMIN 3.1* 2.5* 2.3* 2.1*   No results for input(s): LIPASE, AMYLASE in the last 168 hours. No results for input(s): AMMONIA in the last 168 hours. Coagulation Profile: No results for input(s): INR, PROTIME in the last 168 hours. Cardiac Enzymes: Recent Labs  Lab 09/05/21 2225 09/06/21 1441 09/07/21 0349 09/08/21 0552  CKTOTAL >4,100* 5,530* 2,625* 839*   BNP (last 3 results) No results for input(s): PROBNP in the last 8760 hours. HbA1C: Recent Labs    09/06/21 1441  HGBA1C 6.6*   CBG: Recent Labs  Lab 09/07/21 0640 09/07/21 1131 09/07/21 1629 09/07/21 2147 09/08/21 0854  GLUCAP 160* 237* 172* 197* 213*   Lipid Profile: No results for input(s): CHOL, HDL, LDLCALC, TRIG, CHOLHDL, LDLDIRECT in the last 72 hours. Thyroid Function Tests: No results for input(s): TSH, T4TOTAL, FREET4, T3FREE, THYROIDAB in the last 72 hours. Anemia Panel: No results for input(s): VITAMINB12, FOLATE, FERRITIN, TIBC, IRON, RETICCTPCT in the last 72 hours. Sepsis Labs: Recent Labs  Lab 09/06/21 0022  LATICACIDVEN 1.9    Recent Results (from the past 240 hour(s))  Blood culture (routine x 2)     Status: None (Preliminary result)   Collection Time: 09/05/21 11:05 PM   Specimen: BLOOD LEFT FOREARM  Result Value Ref Range Status   Specimen Description BLOOD LEFT FOREARM  Final   Special Requests   Final    BOTTLES DRAWN AEROBIC AND ANAEROBIC Blood Culture adequate volume   Culture   Final    NO GROWTH 3 DAYS Performed at [facility], [address], [place], [place] [address]    Report Status PENDING  Incomplete  Resp  Panel by RT-PCR (Flu A&B, Covid)     Status: None   Collection Time: 09/05/21 11:13 PM   Specimen: Nasopharyngeal(NP) swabs in vial transport medium  Result Value Ref Range Status   SARS Coronavirus 2 by RT PCR NEGATIVE NEGATIVE Final    Comment: (NOTE) SARS-CoV-2 target nucleic acids are NOT DETECTED.  The SARS-CoV-2 RNA is generally detectable in upper respiratory specimens during the acute phase of infection. The lowest concentration of SARS-CoV-2 viral copies this assay can detect is 138 copies/mL. A negative result does not preclude SARS-Cov-2 infection and should not be used as the sole basis for treatment or other patient management decisions. A negative result may occur with  improper specimen collection/handling, submission of specimen other than nasopharyngeal swab, presence of viral mutation(s) within the areas targeted by this assay, and inadequate number of viral copies(<138 copies/mL). A negative result must be combined with clinical observations, patient history, and epidemiological information. The expected result is Negative.  Fact Sheet for Patients:  EntrepreneurPulse.com.au  Fact Sheet for Healthcare Providers:  IncredibleEmployment.be  This test is no t yet approved or cleared by the [place] FDA and  has been authorized for detection and/or diagnosis of SARS-CoV-2 by FDA under an Emergency Use Authorization (EUA). This EUA will remain  in effect (meaning this test can be used) for the duration of the COVID-19 declaration under  Section 564(b)(1) of the Act, 21 U.S.C.section 360bbb-3(b)(1), unless the authorization is terminated  or revoked sooner.       Influenza A by PCR NEGATIVE NEGATIVE Final   Influenza B by PCR NEGATIVE NEGATIVE Final    Comment: (NOTE) The Xpert Xpress SARS-CoV-2/FLU/RSV plus assay is intended as an aid in the diagnosis of influenza from Nasopharyngeal swab specimens and should not be used as  a sole basis for treatment. Nasal washings and aspirates are unacceptable for Xpert Xpress SARS-CoV-2/FLU/RSV testing.  Fact Sheet for Patients: EntrepreneurPulse.com.au  Fact Sheet for Healthcare Providers: IncredibleEmployment.be  This test is not yet approved or cleared by the [place] FDA and has been authorized for detection and/or diagnosis of SARS-CoV-2 by FDA under an Emergency Use Authorization (EUA). This EUA will remain in effect (meaning this test can be used) for the duration of the COVID-19 declaration under Section 564(b)(1) of the Act, 21 U.S.C. section 360bbb-3(b)(1), unless the authorization is terminated or revoked.  Performed at [facility], [place] [address] [place], [place] [address]   Blood culture (routine x 2)     Status: None (Preliminary result)   Collection Time: 09/05/21 11:14 PM   Specimen: BLOOD RIGHT HAND  Result Value Ref Range Status   Specimen Description BLOOD RIGHT HAND  Final   Special Requests   Final    BOTTLES DRAWN AEROBIC AND ANAEROBIC Blood Culture results may not be optimal due to an inadequate volume of blood received in culture bottles   Culture   Final    NO GROWTH 3 DAYS Performed at [facility], [place] [address] [place], [place] [address]    Report Status PENDING  Incomplete  Urine Culture     Status: Abnormal (Preliminary result)   Collection Time: 09/06/21  4:28 AM   Specimen: Urine, Clean Catch  Result Value Ref Range Status   Specimen Description URINE, CLEAN CATCH  Final   Special Requests NONE  Final   Culture (A)  Final    >=100,000 COLONIES/mL STAPHYLOCOCCUS EPIDERMIDIS >=100,000 COLONIES/mL PSEUDOMONAS AERUGINOSA SUSCEPTIBILITIES TO FOLLOW repeating Performed at [facility] [address], [place], [place] [address]    Report Status PENDING  Incomplete     Radiology Studies: NM Pulmonary Perfusion  Result Date: 09/06/2021 CLINICAL DATA:  75 year old  male with chest pain and chronic cough. Tachycardic. EXAM: NUCLEAR MEDICINE PERFUSION LUNG SCAN TECHNIQUE: Perfusion images were obtained in multiple projections after intravenous injection of radiopharmaceutical. RADIOPHARMACEUTICALS:  4.1 mCi Tc-89m MAA COMPARISON:  CT chest 09/06/2021 FINDINGS: Single linear defect in the posterior LEFT lower lobe seen on posterior projection. This perfusion defect is favored to correspond atelectasis on comparison CT. No wedge-shaped peripheral perfusion defects to suggest acute pulmonary. IMPRESSION: 1. No evidence acute pulmonary embolism. 2. LEFT basilar atelectasis. Electronically Signed   By: Suzy Bouchard M.D.   On: 09/06/2021 13:17   EEG adult  Result Date: 09/07/2021 Lora Havens, MD     09/07/2021  2:41 PM Patient Name: Priyansh Pry Balingit MRN: [id number] Epilepsy Attending: Lora Havens Referring Physician/Provider: Dr Derrick Ravel Date: 09/07/2021 Duration: 27.05 mins Patient history: 75 year old male with altered mental status.  EEG to evaluate for seizure. Level of alertness: Awake, asleep AEDs during EEG study: None Technical aspects: This EEG study was done with scalp electrodes positioned according to the 10-20 International system of electrode placement. Electrical activity was acquired at a sampling rate of 500Hz  and reviewed with a high frequency filter of  70Hz  and a low frequency filter of 1Hz . EEG data were recorded continuously and digitally stored. Description: The posterior dominant rhythm consists of 8-9 Hz activity of moderate voltage (25-35 uV) seen predominantly in posterior head regions, symmetric and reactive to eye opening and eye closing. Sleep was characterized by vertex waves, sleep spindles (12 to 14 Hz), maximal frontocentral region. Hyperventilation and photic stimulation were not performed.   IMPRESSION: This study is within normal limits. No seizures or epileptiform discharges were seen throughout the recording. Lora Havens   ECHOCARDIOGRAM COMPLETE  Result Date: 09/06/2021    ECHOCARDIOGRAM REPORT   Patient Name:   OSKAR CRETELLA Date of Exam: 09/06/2021 Medical Rec #:  [id number]      Height:       67.0 in Accession #:    [id number]     Weight:       230.0 lb Date of Birth:  [birth year]      BSA:          2.146 m Patient Age:    22 years       BP:           107/70 mmHg Patient Gender: M              HR:           105 bpm. Exam Location:  Inpatient Procedure: 2D Echo, Cardiac Doppler and Color Doppler Indications:    Syncope  History:        Patient has no prior history of Echocardiogram examinations.                 CAD; Risk Factors:Diabetes and Hypertension.  Sonographer:    Maudry Mayhew MHA, RDMS, RVT, RDCS Referring Phys: [id number] [facility]  Sonographer Comments: Image acquisition challenging due to respiratory motion. IMPRESSIONS  1. Left ventricular ejection fraction, by estimation, is 60 to 65%. The left ventricle has normal function. Left ventricular endocardial border not optimally defined to evaluate regional wall motion. Indeterminate diastolic filling due to E-A fusion.  2. Right ventricular systolic function is normal. The right ventricular size is normal. There is normal pulmonary artery systolic pressure. The estimated right ventricular systolic pressure is 8.4 mmHg.  3. The mitral valve is grossly normal. No evidence of mitral valve regurgitation. No evidence of mitral stenosis.  4. The aortic valve is tricuspid. There is mild calcification of the aortic valve. Aortic valve regurgitation is not visualized. Aortic valve sclerosis is present, with no evidence of aortic valve stenosis.  5. The inferior vena cava is normal in size with greater than 50% respiratory variability, suggesting right atrial pressure of 3 mmHg. FINDINGS  Left Ventricle: Left ventricular ejection fraction, by estimation, is 60 to 65%. The left ventricle has normal function. Left ventricular endocardial border not optimally  defined to evaluate regional wall motion. The left ventricular internal cavity size was normal in size. There is no left ventricular hypertrophy. Indeterminate diastolic filling due to E-A fusion. Right Ventricle: The right ventricular size is normal. No increase in right ventricular wall thickness. Right ventricular systolic function is normal. There is normal pulmonary artery systolic pressure. The tricuspid regurgitant velocity is 1.16 m/s, and  with an assumed right atrial pressure of 3 mmHg, the estimated right ventricular systolic pressure is 8.4 mmHg. Left Atrium: Left atrial size was normal in size. Right Atrium: Right atrial size was normal in size. Pericardium: Trivial pericardial effusion is present. Mitral Valve: The mitral valve is grossly normal. No  evidence of mitral valve regurgitation. No evidence of mitral valve stenosis. Tricuspid Valve: The tricuspid valve is grossly normal. Tricuspid valve regurgitation is not demonstrated. No evidence of tricuspid stenosis. Aortic Valve: The aortic valve is tricuspid. There is mild calcification of the aortic valve. Aortic valve regurgitation is not visualized. Aortic valve sclerosis is present, with no evidence of aortic valve stenosis. Aortic valve mean gradient measures 4.0 mmHg. Aortic valve peak gradient measures 8.1 mmHg. Aortic valve area, by VTI measures 2.38 cm. Pulmonic Valve: The pulmonic valve was grossly normal. Pulmonic valve regurgitation is not visualized. No evidence of pulmonic stenosis. Aorta: The aortic root is normal in size and structure. Venous: The inferior vena cava is normal in size with greater than 50% respiratory variability, suggesting right atrial pressure of 3 mmHg. IAS/Shunts: The atrial septum is grossly normal.  LEFT VENTRICLE PLAX 2D LVIDd:         5.30 cm     Diastology LVIDs:         4.70 cm     LV e' medial:    5.77 cm/s LV PW:         0.70 cm     LV E/e' medial:  13.2 LV IVS:        0.55 cm     LV e' lateral:   5.87 cm/s  LVOT diam:     1.80 cm     LV E/e' lateral: 13.0 LV SV:         49 LV SV Index:   23 LVOT Area:     2.54 cm    3D Volume EF                            LV 3D EDV:   152.85 ml                            LV 3D ESV:   34.12 ml LV Volumes (MOD) LV vol d, MOD A2C: 52.1 ml LV vol d, MOD A4C: 72.0 ml LV vol s, MOD A2C: 25.4 ml LV vol s, MOD A4C: 32.3 ml LV SV MOD A2C:     26.7 ml LV SV MOD A4C:     72.0 ml LV SV MOD BP:      34.4 ml RIGHT VENTRICLE RV S prime:     14.10 cm/s TAPSE (M-mode): 1.5 cm LEFT ATRIUM             Index        RIGHT ATRIUM           Index LA diam:        2.80 cm 1.30 cm/m   RA Area:     12.50 cm LA Vol (A2C):   44.9 ml 20.92 ml/m  RA Volume:   27.80 ml  12.95 ml/m LA Vol (A4C):   33.0 ml 15.38 ml/m LA Biplane Vol: 40.8 ml 19.01 ml/m  AORTIC VALVE AV Area (Vmax):    2.20 cm AV Area (Vmean):   2.31 cm AV Area (VTI):     2.38 cm AV Vmax:           142.00 cm/s AV Vmean:          93.100 cm/s AV VTI:            0.206 m AV Peak Grad:      8.1 mmHg AV Mean Grad:  4.0 mmHg LVOT Vmax:         123.00 cm/s LVOT Vmean:        84.500 cm/s LVOT VTI:          0.193 m LVOT/AV VTI ratio: 0.94  AORTA Ao Root diam: 3.00 cm MITRAL VALVE                TRICUSPID VALVE MV Area (PHT): 2.50 cm     TR Peak grad:   5.4 mmHg MV Decel Time: 304 msec     TR Vmax:        116.00 cm/s MV E velocity: 76.40 cm/s MV A velocity: 107.00 cm/s  SHUNTS MV E/A ratio:  0.71         Systemic VTI:  0.19 m                             Systemic Diam: 1.80 cm Eleonore Chiquito MD Electronically signed by Eleonore Chiquito MD Signature Date/Time: 09/06/2021/1:20:31 PM    Final    VAS US CAROTID  Result Date: 09/06/2021 Carotid Arterial Duplex Study Patient Name:  ZACKARI RUANE  Date of Exam:   09/06/2021 Medical Rec #: [id number]       Accession #:    [id number] Date of Birth: 02-Dec-1945       Patient Gender: M Patient Age:   55 years Exam Location:  [facility] Procedure:      VAS US CAROTID Referring Phys: Wandra Feinstein RATHORE  --------------------------------------------------------------------------------  Indications:       Syncope. Risk Factors:      Hypertension, Diabetes, past history of smoking, coronary                    artery disease. Comparison Study:  No prior study Performing Technologist: Maudry Mayhew MHA, RDMS, RVT, RDCS  Examination Guidelines: A complete evaluation includes B-mode imaging, spectral Doppler, color Doppler, and power Doppler as needed of all accessible portions of each vessel. Bilateral testing is considered an integral part of a complete examination. Limited examinations for reoccurring indications may be performed as noted.  Right Carotid Findings: +----------+--------+--------+--------+----------------------------+--------+           PSV cm/sEDV cm/sStenosisPlaque Description          Comments +----------+--------+--------+--------+----------------------------+--------+ CCA Prox  83      24                                                   +----------+--------+--------+--------+----------------------------+--------+ CCA Distal80      14              smooth and heterogenous              +----------+--------+--------+--------+----------------------------+--------+ ICA Prox  82      30              smooth and homogeneous               +----------+--------+--------+--------+----------------------------+--------+ ICA Distal113     37                                                   +----------+--------+--------+--------+----------------------------+--------+ ECA  107     21              hyperechoic and heterogenous         +----------+--------+--------+--------+----------------------------+--------+ +----------+--------+-------+----------------+-------------------+           PSV cm/sEDV cmsDescribe        Arm Pressure (mmHG) +----------+--------+-------+----------------+-------------------+ [id number]             Multiphasic, WNL                     +----------+--------+-------+----------------+-------------------+ +---------+--------+--+--------+--+---------+ VertebralPSV cm/s41EDV cm/s13Antegrade +---------+--------+--+--------+--+---------+  Left Carotid Findings: +----------+-------+--------+--------+--------------------------------+--------+           PSV    EDV cm/sStenosisPlaque Description              Comments           cm/s                                                            +----------+-------+--------+--------+--------------------------------+--------+ CCA Prox  100    24              focal and heterogenous                   +----------+-------+--------+--------+--------------------------------+--------+ CCA Distal101    25                                                       +----------+-------+--------+--------+--------------------------------+--------+ ICA Prox  54     19              focal, hyperechoic and                                                    heterogenous                             +----------+-------+--------+--------+--------------------------------+--------+ ICA Distal84     33                                                       +----------+-------+--------+--------+--------------------------------+--------+ ECA       58     13              heterogenous and hyperechoic             +----------+-------+--------+--------+--------------------------------+--------+ +----------+--------+--------+----------------+-------------------+           PSV cm/sEDV cm/sDescribe        Arm Pressure (mmHG) +----------+--------+--------+----------------+-------------------+ [id number]             Multiphasic, WNL                    +----------+--------+--------+----------------+-------------------+ +---------+--------+--+--------+--+---------+ VertebralPSV cm/s46EDV cm/s15Antegrade +---------+--------+--+--------+--+---------+   Summary: Right  Carotid: Velocities in the right ICA are consistent with a 1-39% stenosis.  Left Carotid: Velocities in the left ICA are consistent with a 1-39% stenosis. Vertebrals:  Bilateral vertebral arteries demonstrate antegrade flow. Subclavians: Normal flow hemodynamics were seen in bilateral subclavian              arteries. *See table(s) above for measurements and observations.  Electronically signed by Harold Barban MD on 09/06/2021 at 11:46:53 PM.    Final    VAS [place] LOWER EXTREMITY VENOUS (DVT)  Result Date: 09/06/2021  Lower Venous DVT Study Patient Name:  KWANE ROHL  Date of Exam:   09/06/2021 Medical Rec #: [id number]       Accession #:    [id number] Date of Birth: Mar 03, 1946       Patient Gender: M Patient Age:   11 years Exam Location:  [facility] Procedure:      VAS [place] LOWER EXTREMITY VENOUS (DVT) Referring Phys: Wandra Feinstein RATHORE --------------------------------------------------------------------------------  Indications: Edema.  Comparison Study: No prior study Performing Technologist: Maudry Mayhew MHA, RDMS, RVT, RDCS  Examination Guidelines: A complete evaluation includes B-mode imaging, spectral Doppler, color Doppler, and power Doppler as needed of all accessible portions of each vessel. Bilateral testing is considered an integral part of a complete examination. Limited examinations for reoccurring indications may be performed as noted. The reflux portion of the exam is performed with the patient in reverse Trendelenburg.  +---------+---------------+---------+-----------+----------+--------------+ RIGHT    CompressibilityPhasicitySpontaneityPropertiesThrombus Aging +---------+---------------+---------+-----------+----------+--------------+ CFV      Full           Yes      Yes                                 +---------+---------------+---------+-----------+----------+--------------+ SFJ      Full                                                         +---------+---------------+---------+-----------+----------+--------------+ FV Prox  Full                                                        +---------+---------------+---------+-----------+----------+--------------+ FV Mid   Full                                                        +---------+---------------+---------+-----------+----------+--------------+ FV DistalFull                                                        +---------+---------------+---------+-----------+----------+--------------+ PFV      Full                                                        +---------+---------------+---------+-----------+----------+--------------+  POP      Full           Yes      Yes                                 +---------+---------------+---------+-----------+----------+--------------+ PTV      Full                                                        +---------+---------------+---------+-----------+----------+--------------+ PERO     None                    No                   Acute          +---------+---------------+---------+-----------+----------+--------------+   +---------+---------------+---------+-----------+----------+--------------+ LEFT     CompressibilityPhasicitySpontaneityPropertiesThrombus Aging +---------+---------------+---------+-----------+----------+--------------+ CFV      Full           Yes      Yes                                 +---------+---------------+---------+-----------+----------+--------------+ SFJ      Full                                                        +---------+---------------+---------+-----------+----------+--------------+ FV Prox  Full                                                        +---------+---------------+---------+-----------+----------+--------------+ FV Mid   Full                                                         +---------+---------------+---------+-----------+----------+--------------+ FV DistalFull                                                        +---------+---------------+---------+-----------+----------+--------------+ PFV      Full                                                        +---------+---------------+---------+-----------+----------+--------------+ POP      Full           Yes      Yes                                 +---------+---------------+---------+-----------+----------+--------------+  PTV      Full                                                        +---------+---------------+---------+-----------+----------+--------------+ PERO     Full                                                        +---------+---------------+---------+-----------+----------+--------------+     Summary: RIGHT: - Findings consistent with acute deep vein thrombosis involving the right peroneal veins. - No cystic structure found in the popliteal fossa.  LEFT: - There is no evidence of deep vein thrombosis in the lower extremity.  - No cystic structure found in the popliteal fossa.  *See table(s) above for measurements and observations. Electronically signed by Harold Barban MD on 09/06/2021 at 7:47:09 PM.    Final      LOS: 2 days   Antonieta Pert, MD [facility]  09/08/2021, 8:57 AM

## 2021-09-08 NOTE — Evaluation (Signed)
Physical Therapy Evaluation Patient Details Name: Fernando Blankenship MRN: [id number] DOB: 09-Dec-1945 Today's Date: 09/08/2021  History of Present Illness  Pt is a 75 y.o. M who presents 09/05/2021 with AMS after being found down at home. Admitted with complicated UTI, free fluid in left paracolic gutter, RLE DVT, AKI, hyperkalemia, mild rhabdomyolysis. Significant PMH: CAD s/p PCI, HTN, DM2, prostate CA s/p radioactive seed implant in august 2022.  Clinical Impression  PTA, pt reports he lives alone, uses a cane intermittently, and works for [employer] (unsure of accuracy). Pt now presenting with cognitive deficits, including decreased orientation, problem solving, awareness, and memory, in addition to impaired balance and weakness. Pt unable to statically stand without external support. Ambulating 80 feet with a walker at a min assist level (+2 for equipment). Pt would benefit from post acute rehab to address deficits and maximize functional mobility.      Recommendations for follow up therapy are one component of a multi-disciplinary discharge planning process, led by the attending physician.  Recommendations may be updated based on patient status, additional functional criteria and insurance authorization.  Follow Up Recommendations Acute inpatient rehab (3hours/day)    Assistance Recommended at Discharge Frequent or constant Supervision/Assistance  Functional Status Assessment Patient has had a recent decline in their functional status and demonstrates the ability to make significant improvements in function in a reasonable and predictable amount of time.  Equipment Recommendations  Rolling walker (2 wheels)    Recommendations for Other Services       Precautions / Restrictions Precautions Precautions: Fall Restrictions Weight Bearing Restrictions: No      Mobility  Bed Mobility Overal bed mobility: Needs Assistance Bed Mobility: Supine to Sit     Supine to sit:  Supervision     General bed mobility comments: cues for initiation    Transfers Overall transfer level: Needs assistance Equipment used: Rolling walker (2 wheels) Transfers: Sit to/from Stand Sit to Stand: Min assist;+2 safety/equipment           General transfer comment: Pt initially stood with no UE support from edge of bed, demonstrated posterior lean, with decreased eccentric control back to edge of bed. Then trialed RW, cues provided for hand placement. Overall, minA to power up and steady    Ambulation/Gait Ambulation/Gait assistance: Min assist;+2 safety/equipment Gait Distance (Feet): 80 Feet Assistive device: Rolling walker (2 wheels) Gait Pattern/deviations: Step-through pattern;Decreased stride length Gait velocity: decreased Gait velocity interpretation: <1.8 ft/sec, indicate of risk for recurrent falls   General Gait Details: Mod cueing for walker proximity, environmental navigation, segmental turning. Pt with dynamic instability, requiring minA (+2 for line management). Increased imbalance when impulsively removing hands from walker  Stairs            Wheelchair Mobility    Modified Rankin (Stroke Patients Only)       Balance Overall balance assessment: Needs assistance Sitting-balance support: Feet supported Sitting balance-Leahy Scale: Good Sitting balance - Comments: Able to don socks edge of bed with increased time and supervision   Standing balance support: No upper extremity supported;During functional activity Standing balance-Leahy Scale: Poor Standing balance comment: reliant on external support                             Pertinent Vitals/Pain Pain Assessment: No/denies pain    Home Living Family/patient expects to be discharged to:: Private residence Living Arrangements: Alone Available Help at Discharge: Family Type of Home: [place]  Access: Stairs to enter   [employer] of Steps: 2 Alternate Level  Stairs-Number of Steps: flight Home Layout: Two level;Able to live on main level with bedroom/bathroom;1/2 bath on main level Home Equipment: Grab bars - tub/shower;Cane - single point;Shower seat Additional Comments: Pt report his children are on the way from [place]    Prior Function Prior Level of Function : Independent/Modified Independent             Mobility Comments: uses cane sometimes when he feels unblance per pt report ADLs Comments: pt reports that he was Ind with  ADLs, home mgt, cooking and was driving     Hand Dominance   Dominant Hand: Right    Extremity/Trunk Assessment   Upper Extremity Assessment Upper Extremity Assessment: Defer to OT evaluation    Lower Extremity Assessment Lower Extremity Assessment: RLE deficits/detail;LLE deficits/detail RLE Deficits / Details: Grossly 3-/5 RLE Sensation: history of peripheral neuropathy LLE Deficits / Details: Grossly 3-/5 LLE Sensation: history of peripheral neuropathy       Communication   Communication: No difficulties  Cognition Arousal/Alertness: Awake/alert Behavior During Therapy: WFL for tasks assessed/performed Overall Cognitive Status: Impaired/Different from baseline Area of Impairment: Orientation;Memory;Following commands;Safety/judgement;Awareness;Problem solving                 Orientation Level: Disoriented to;Place   Memory: Decreased short-term memory Following Commands: Follows one step commands inconsistently Safety/Judgement: Decreased awareness of safety;Decreased awareness of deficits Awareness: Intellectual Problem Solving: Difficulty sequencing;Requires verbal cues General Comments: Pt initially oriented to place, stating he was at [facility], but later stating he is at a hospital off "[address]," which he later to corrected to "[address];" pt thought he was at [facility] after further questioning. Pt with poor problem solving abilities, often requiring repetitive multimodal cues to  execute task and wayfind in hallway. Decreased STM. Does not recall events leading to hospitalization        General Comments      Exercises     Assessment/Plan    PT Assessment Patient needs continued PT services  PT Problem List Decreased strength;Decreased activity tolerance;Decreased balance;Decreased mobility;Decreased cognition;Decreased safety awareness       PT Treatment Interventions DME instruction;Stair training;Gait training;Functional mobility training;Therapeutic exercise;Balance training;Therapeutic activities;Patient/family education    PT Goals (Current goals can be found in the Care Plan section)  Acute Rehab PT Goals Patient Stated Goal: did not state PT Goal Formulation: With patient Time For Goal Achievement: 09/22/21 Potential to Achieve Goals: Good    Frequency Min 3X/week   Barriers to discharge        Co-evaluation PT/OT/SLP Co-Evaluation/Treatment: Yes Reason for Co-Treatment: For patient/therapist safety;To address functional/ADL transfers PT goals addressed during session: Mobility/safety with mobility         AM-PAC PT "6 Clicks" Mobility  Outcome Measure Help needed turning from your back to your side while in a flat bed without using bedrails?: A Little Help needed moving from lying on your back to sitting on the side of a flat bed without using bedrails?: A Little Help needed moving to and from a bed to a chair (including a wheelchair)?: A Little Help needed standing up from a chair using your arms (e.g., wheelchair or bedside chair)?: A Little Help needed to walk in hospital room?: A Little Help needed climbing 3-5 steps with a railing? : A Lot 6 Click Score: 17    End of Session Equipment Utilized During Treatment: Gait belt Activity Tolerance: Patient tolerated treatment well Patient left:  in chair;with call bell/phone within reach;with chair alarm set Nurse Communication: Mobility status PT Visit Diagnosis: Unsteadiness on  feet (R26.81);Muscle weakness (generalized) (M62.81);Difficulty in walking, not elsewhere classified (R26.2)    Time: 5672-0919 PT Time Calculation (min) (ACUTE ONLY): 36 min   Charges:   PT Evaluation $PT Eval Moderate Complexity: 1 Mod          Wyona Almas, PT, DPT Acute Rehabilitation Services Pager [phone] Office [phone]   Deno Etienne 09/08/2021, 10:50 AM

## 2021-09-08 NOTE — Progress Notes (Signed)
Inpatient Rehab Admissions Coordinator:  ? ?Per therapy recommendations,  patient was screened for CIR candidacy by Farhiya Rosten, MS, CCC-SLP. At this time, Pt. Appears to be a a potential candidate for CIR. I will place   order for rehab consult per protocol for full assessment. Please contact me any with questions. ? ?Junius Faucett, MS, CCC-SLP ?Rehab Admissions Coordinator  ?[phone] (celll) ?[phone] (office) ? ?

## 2021-09-09 DIAGNOSIS — A419 Sepsis, unspecified organism: Secondary | ICD-10-CM | POA: Diagnosis not present

## 2021-09-09 LAB — URINE CULTURE: Culture: >=100000 — AB

## 2021-09-09 LAB — GLUCOSE, CAPILLARY
Glucose-Capillary: 149 mg/dL — ABNORMAL HIGH (ref 70–99)
Glucose-Capillary: 183 mg/dL — ABNORMAL HIGH (ref 70–99)
Glucose-Capillary: 175 mg/dL — ABNORMAL HIGH (ref 70–99)
Glucose-Capillary: 177 mg/dL — ABNORMAL HIGH (ref 70–99)

## 2021-09-09 LAB — COMPREHENSIVE METABOLIC PANEL
ALT: 69 U/L — ABNORMAL HIGH (ref 0–44)
AST: 79 U/L — ABNORMAL HIGH (ref 15–41)
Albumin: 2.1 g/dL — ABNORMAL LOW (ref 3.5–5.0)
Alkaline Phosphatase: 46 U/L (ref 38–126)
Anion gap: 8 (ref 5–15)
BUN: 25 mg/dL — ABNORMAL HIGH (ref 8–23)
CO2: 21 mmol/L — ABNORMAL LOW (ref 22–32)
Calcium: 7.3 mg/dL — ABNORMAL LOW (ref 8.9–10.3)
Chloride: 105 mmol/L (ref 98–111)
Creatinine, Ser: 1.18 mg/dL (ref 0.61–1.24)
GFR, Estimated: >60 mL/min (ref >60)
Glucose, Bld: 166 mg/dL — ABNORMAL HIGH (ref 70–99)
Potassium: 3.4 mmol/L — ABNORMAL LOW (ref 3.5–5.1)
Sodium: 134 mmol/L — ABNORMAL LOW (ref 135–145)
Total Bilirubin: 0.7 mg/dL (ref 0.3–1.2)
Total Protein: 5.1 g/dL — ABNORMAL LOW (ref 6.5–8.1)

## 2021-09-09 LAB — CBC
HCT: 32.1 % — ABNORMAL LOW (ref 39.0–52.0)
Hemoglobin: 10.1 g/dL — ABNORMAL LOW (ref 13.0–17.0)
MCH: 29.3 pg (ref 26.0–34.0)
MCHC: 31.5 g/dL (ref 30.0–36.0)
MCV: 93 fL (ref 80.0–100.0)
Platelets: 148 10*3/uL — ABNORMAL LOW (ref 150–400)
RBC: 3.45 MIL/uL — ABNORMAL LOW (ref 4.22–5.81)
RDW: 16.2 % — ABNORMAL HIGH (ref 11.5–15.5)
WBC: 6.2 10*3/uL (ref 4.0–10.5)
nRBC: 0 % (ref 0.0–0.2)

## 2021-09-09 LAB — HEPARIN LEVEL (UNFRACTIONATED): Heparin Unfractionated: 0.36 IU/mL (ref 0.30–0.70)

## 2021-09-09 LAB — CK: Total CK: 555 U/L — ABNORMAL HIGH (ref 49–397)

## 2021-09-09 MED ORDER — APIXABAN 5 MG PO TABS
10.0000 mg | ORAL_TABLET | Freq: Two times a day (BID) | ORAL | Status: DC
  Administered 2021-09-09 – 2021-09-12 (×7): 10 mg via ORAL
  Filled 2021-09-09 (×7): qty 2

## 2021-09-09 MED ORDER — DOCUSATE SODIUM 100 MG PO CAPS
100.0000 mg | ORAL_CAPSULE | Freq: Two times a day (BID) | ORAL | Status: DC
  Administered 2021-09-09 – 2021-09-12 (×5): 100 mg via ORAL
  Filled 2021-09-09 (×6): qty 1

## 2021-09-09 MED ORDER — APIXABAN 5 MG PO TABS: 5.0000 mg | ORAL_TABLET | Freq: Two times a day (BID) | ORAL | Status: DC

## 2021-09-09 NOTE — Progress Notes (Signed)
ANTICOAGULATION CONSULT NOTE - Follow Up Consult  Pharmacy Consult for Heparin > Apixaban Indication: RLE DVT  Allergies  Allergen Reactions   Prozac [Fluoxetine] Anaphylaxis   Other Other (See Comments)    Plavix cause his lips to swell    Patient Measurements: Height: 5\' 7"  (170.2 cm) Weight: 104.3 kg (230 lb) IBW/kg (Calculated) : 66.1 Heparin Dosing Weight: 89 kg  Vital Signs: Temp: 98.3 F (36.8 C) (12/14 0423) Temp Source: Oral (12/14 0423) BP: 119/67 (12/14 0423) Pulse Rate: 65 (12/14 0423)  Labs: Recent Labs    09/07/21 0349 09/07/21 1008 09/08/21 0552 09/09/21 0341  HGB 10.2*  --  10.3* 10.1*  HCT 32.1*  --  31.1* 32.1*  PLT 138*  --  137* 148*  HEPARINUNFRC 0.37 0.35 0.44 0.36  CREATININE 2.17*  --  1.47* 1.18  CKTOTAL 2,625*  --  839* 555*     Estimated Creatinine Clearance: 62.3 mL/min (by C-G formula based on SCr of 1.18 mg/dL).  Assessment:  75 yr old male on IV heparin for RLE DVT.  VQ scan negative for PE.   Heparin level remains therapeutic (0.36) on 1800 units/hr.  CBC trended down some, now stable. Rhabdomyolysis, creatinine and LFTs trending down. Platelet count 160 on admit > dropped ~20% > back to 148 today. No bleeding reported.  Goal of Therapy:  Heparin level 0.3-0.7 units/ml Appropriate Apixaban dose for indication Monitor platelets by anticoagulation protocol: Yes   Plan:  Heparin drip to stop when giving first Eliquis dose. Eliquis 10 mg BID x 1 week then 5 mg BID. Intermittent CBC, monitor for bleeding.  Arty Baumgartner, RPh 09/09/2021,8:43 AM

## 2021-09-09 NOTE — Progress Notes (Signed)
PROGRESS NOTE    Fernando Blankenship  MAU:[id number] DOB: Dec 21, 1945 DOA: 09/05/2021 PCP: Lavone Orn, MD   Chief Complaint  Patient presents with   Altered Mental Status  Brief Narrative/Hospital Course: Fernando Blankenship, 75 y.o. male with PMH of  CAD status post PCI in 2014, hypertension, hyperlipidemia, GERD, non-insulin-dependent type 2 diabetes, prostate cancer status post radioactive seed implant in August 2022, RLS, BPH presented to the ED via EMS for evaluation of altered mental status.  Patient lives by himself was seen 2 to 3 days prior to admission, was not answering call and had to break into residents and was found down on the ground incontinent of urine and feces, was confused delirious and hypoxic on room air placed on oxygen and brought to the ED  In the ED, hypoxic to 80s on room air, lab with leukocytosis 17 K, hemoglobin 14.4 and dropped to 12.6 after fluid boluses, mild hyperkalemia, was also febrile 103.5 tachycardic, tachypneic.  Chest x-ray no pneumonia CT head no acute finding,CT scan abdomen with features of cystitis, nonspecific fluid collection in the left pericolic gutter suspicious for hemorrhage, EDP consulted the trauma surgery, patient was also placed on bicarb infusion and heparin drip as pulmonary embolism is being ruled out.  Chest x-ray unremarkable. Patient has managed with IV antibiotics IV fluid hydrations, his renal function is slowly improved and normalized.  He remains deconditioned and weak frail and confused and mental status has improved.  PT OT has suggested rehab placement. He had easily retention and at home he does intermittent self-catheterization up to 10 times.  Discussed with his urologist and planning to continue Foley catheter for now and he will need outpatient follow-up At this time planning for inpatient rehabilitation  Subjective: Seen and examined this morning.  Patient's son and daughter are at the bedside Overnight slight hypokalemia  otherwise renal function improved No fever He appears alert awake oriented He is worried about the staff at home and wantingto go home soon  Assessment & Plan:  Severe sepsis POA secondary to Pseudomonas UTI Complicated UTI: At baseline does intermittent self catheterization at home since his prostate cancer treatment Patient is clinically improving, hemodynamically stable, no more fever.  Leukocytosis has resolved.  Blood culture no growth so far, urine culture with  staph epidermidis  and Pseudomonas .  Discussed with ID Dr Candiss Norse staph epidermidis is felt tl be contamination, patient is improving with cefepime.  I had discussed with his urologist Dr. Diona Fanti advised to keep the Foley catheter and he will follow-up outpatient.  We will continue continue with cefepime based on culture sensitivity x7 days Recent Labs  Lab 09/05/21 2225 09/06/21 0022 09/06/21 1441 09/07/21 0349 09/08/21 0552 09/09/21 0341  WBC 17.0*  --  12.9* 9.4 8.2 6.2  LATICACIDVEN  --  1.9  --   --   --   --      Acute metabolic encephalopathy due to sepsis UTI: Resolved.  Continue PT OT and supportive care  Right lower extremity DVT, negative VQ scan. On heparin drip pharmacy-we will plan to transition to Eliquis today.  BPH Prostate cancer with radioactive seed implanted in August 2022 Dr Dahlstead/Dr Tammi Klippel. 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 Diona Fanti advised to keep the Foley catheter in place and he will follow-up as outpatient, continues flomax oxybutynin and Solifenacin.   CAD status post PCI in 2014 Positive troponin 46-44 flat no delta - from  demand ischemia in the setting of sepsis.  Denies any chest pain.  Continue his home aspirin, metoprolol  and continue to hold statin due to rhabdomyolysis.    Free fluid in left paracolic gutter,unclear etiology: Patient has no abdominal pain or  complaint hemoglobin has been notably stable so doubt solid organ injury per trauma surgery.  Doubt perforated viscus no further surgical intervention and have signed off.  Patient is tolerating diet  AKI metabolic acidosis Hyperkalemia on presentation: AKI has resolved, metabolic acidosis resolved.  Continue fluid hydration and wean as tolerated. Recent Labs  Lab 09/06/21 0349 09/06/21 1441 09/07/21 0349 09/08/21 0552 09/09/21 0341  BUN 47* 47* 46* 39* 25*  CREATININE 3.02* 2.42* 2.17* 1.47* 1.18   Mild hypokalemia replete orally again. Mild rhabdomyolysis was found on the floor, CK improved Mild transaminitis likely from rhabdomyolysis and sepsis.  Trending down.  GERD continue PPI RLS continues Requip  Diabetes mellitus blood sugars is well controlled on SSI. continue holding metformin and Jardiance. last A1c stable 6.6 Recent Labs  Lab 09/08/21 0854 09/08/21 1118 09/08/21 1641 09/08/21 2107 09/09/21 0640  GLUCAP 213* 172* 161* 206* 149*     Class II Obesity:Patient's Body mass index is 36.02 kg/m. : Will benefit with PCP follow-up, weight loss  healthy lifestyle and outpatient sleep evaluation.  Deconditioning found on down on the floor: Continue PT OT -inpatient rehab has been consulted.    DVT prophylaxis: heparin  Code Status:   Code Status: Full Code Family Communication: plan of care discussed with patient and daughter and son at the bedside today.   Status is: Inpatient Remains inpatient appropriate because: Due to ongoing management of his renal failure DVT and confusion Disposition: Currently not medically stable for discharge. Anticipated Disposition: CIR once bed available.  Objective: Vitals last 24 hrs: Vitals:   09/08/21 0910 09/08/21 1643 09/08/21 2105 09/09/21 0423  BP: 115/69 111/65 121/71 119/67  Pulse: 85 77 88 65  Resp: 20 17 18 20   Temp: 98.4 F (36.9 C) 98.4 F (36.9 C) 98.5 F (36.9 C) 98.3 F (36.8 C)  TempSrc: Oral   Oral  SpO2:  97% 98% 95% 97%  Weight:      Height:       Weight change:   Intake/Output Summary (Last 24 hours) at 09/09/2021 0738 Last data filed at 09/09/2021 0736 Gross per 24 hour  Intake 3451.61 ml  Output 3875 ml  Net -423.39 ml    Net IO Since Admission: 2,220.98 mL [09/09/21 0738]   Physical Examination: General exam: AAOx 3 older than stated age, weak appearing. HEENT:Oral mucosa moist, Ear/Nose WNL grossly, dentition normal. Respiratory system: bilaterally CLEAR, no use of accessory muscle Cardiovascular system: S1 & S2 +, No JVD,. Gastrointestinal system: Abdomen soft,NT,ND, BS+ Nervous System:Alert, awake, moving extremities and grossly nonfocal Extremities: No edema, distal peripheral pulses palpable.  Skin: No rashes,no icterus. MSK: Normal muscle bulk,tone, power .  Medications reviewed:  Scheduled Meds:  aspirin  81 mg Oral Daily   Chlorhexidine Gluconate Cloth  6 each Topical Daily   darifenacin  7.5 mg Oral Daily   insulin aspart  0-5 Units Subcutaneous QHS   insulin aspart  0-9 Units Subcutaneous TID WC   metoprolol succinate  25 mg Oral Daily   multivitamin with minerals  1 tablet Oral Daily   pantoprazole  40 mg Oral Daily   rOPINIRole  1 mg Oral QHS   tamsulosin  0.4 mg Oral BID   Continuous Infusions:  sodium chloride  75 mL/hr at 09/09/21 0736   ceFEPime (MAXIPIME) IV Stopped (09/08/21 2211)   heparin 1,800 Units/hr (09/09/21 0736)   Diet Order             Diet heart healthy/carb modified Room service appropriate? Yes; Fluid consistency: Thin  Diet effective now                   Weight change:   Wt Readings from Last 3 Encounters:  09/05/21 104.3 kg  07/01/21 104.3 kg  05/21/21 112.8 kg     Consultants:see note  Procedures:see note Antimicrobials: Anti-infectives (From admission, onward)    Start     Dose/Rate Route Frequency Ordered Stop   09/08/21 2200  ceFEPIme (MAXIPIME) 2 g in sodium chloride 0.9 % 100 mL IVPB        2 g 200 mL/hr  over 30 Minutes Intravenous Every 12 hours 09/08/21 1625     09/07/21 1200  ceFEPIme (MAXIPIME) 2 g in sodium chloride 0.9 % 100 mL IVPB  Status:  Discontinued        2 g 200 mL/hr over 30 Minutes Intravenous Every 24 hours 09/06/21 0104 09/08/21 1625   09/06/21 1435  vancomycin variable dose per unstable renal function (pharmacist dosing)  Status:  Discontinued         Does not apply See admin instructions 09/06/21 1435 09/06/21 1443   09/05/21 2330  vancomycin (VANCOREADY) IVPB 2000 mg/400 mL        2,000 mg 200 mL/hr over 120 Minutes Intravenous  Once 09/05/21 2318 09/06/21 0227   09/05/21 2315  ceFEPIme (MAXIPIME) 2 g in sodium chloride 0.9 % 100 mL IVPB        2 g 200 mL/hr over 30 Minutes Intravenous  Once 09/05/21 2312 09/06/21 0004   09/05/21 2315  metroNIDAZOLE (FLAGYL) IVPB 500 mg        500 mg 100 mL/hr over 60 Minutes Intravenous  Once 09/05/21 2312 09/06/21 0023   09/05/21 2315  vancomycin (VANCOCIN) IVPB 1000 mg/200 mL premix  Status:  Discontinued        1,000 mg 200 mL/hr over 60 Minutes Intravenous  Once 09/05/21 2312 09/05/21 2318      Culture/Microbiology    Component Value Date/Time   SDES URINE, CLEAN CATCH 09/06/2021 0428   SPECREQUEST  09/06/2021 0428    NONE Performed at [facility], [place] [address] [place], [place] [address]    CULT (A) 09/06/2021 0428    >=100,000 COLONIES/mL STAPHYLOCOCCUS EPIDERMIDIS >=100,000 COLONIES/mL PSEUDOMONAS AERUGINOSA    REPTSTATUS 09/09/2021 FINAL 09/06/2021 0428    Other culture-see note  Unresulted Labs (From admission, onward)     Start     Ordered   09/08/21 0500  Heparin level (unfractionated)  Daily,   R      09/06/21 1710   09/08/21 0500  CK  Daily,   R      09/07/21 0800   09/08/21 0500  Comprehensive metabolic panel  Daily,   R      09/07/21 1311   09/07/21 0500  CBC  Daily,   R      09/06/21 0354   09/06/21 1313  MRSA Next Gen by PCR, Nasal  Once,   R        09/06/21 1312          Data  Reviewed: I have personally reviewed following labs and imaging studies CBC: Recent Labs  Lab 09/05/21 2225 09/06/21 0239 09/06/21 1441 09/07/21  6203 09/08/21 0552 09/09/21 0341  WBC 17.0*  --  12.9* 9.4 8.2 6.2  NEUTROABS 15.0*  --   --   --   --   --   HGB 14.4 12.6* 12.5* 10.2* 10.3* 10.1*  HCT 44.3 37.0* 38.5* 32.1* 31.1* 32.1*  MCV 92.1  --  93.2 93.3 90.7 93.0  PLT 160  --  152 138* 137* 148*    Basic Metabolic Panel: Recent Labs  Lab 09/06/21 0349 09/06/21 1441 09/07/21 0349 09/08/21 0552 09/09/21 0341  NA 132* 134* 134* 136 134*  K 3.9 3.4* 3.3* 3.3* 3.4*  CL 97* 97* 98 103 105  CO2 24 27 26 26  21*  GLUCOSE 228* 165* 184* 162* 166*  BUN 47* 47* 46* 39* 25*  CREATININE 3.02* 2.42* 2.17* 1.47* 1.18  CALCIUM 7.9* 8.2* 7.9* 7.7* 7.3*    GFR: Estimated Creatinine Clearance: 62.3 mL/min (by C-G formula based on SCr of 1.18 mg/dL). Liver Function Tests: Recent Labs  Lab 09/05/21 2225 09/06/21 1441 09/07/21 0349 09/08/21 0552 09/09/21 0341  AST 164* 147* 101* 68* 79*  ALT 48* 50* 47* 48* 69*  ALKPHOS 45 62 39 41 46  BILITOT 3.3* 2.2* 1.5* 0.8 0.7  PROT 6.6 5.7* 5.2* 5.1* 5.1*  ALBUMIN 3.1* 2.5* 2.3* 2.1* 2.1*    No results for input(s): LIPASE, AMYLASE in the last 168 hours. No results for input(s): AMMONIA in the last 168 hours. Coagulation Profile: No results for input(s): INR, PROTIME in the last 168 hours. Cardiac Enzymes: Recent Labs  Lab 09/05/21 2225 09/06/21 1441 09/07/21 0349 09/08/21 0552 09/09/21 0341  CKTOTAL >4,100* 5,530* 2,625* 839* 555*    BNP (last 3 results) No results for input(s): PROBNP in the last 8760 hours. HbA1C: Recent Labs    09/06/21 1441  HGBA1C 6.6*    CBG: Recent Labs  Lab 09/08/21 0854 09/08/21 1118 09/08/21 1641 09/08/21 2107 09/09/21 0640  GLUCAP 213* 172* 161* 206* 149*    Lipid Profile: No results for input(s): CHOL, HDL, LDLCALC, TRIG, CHOLHDL, LDLDIRECT in the last 72 hours. Thyroid  Function Tests: No results for input(s): TSH, T4TOTAL, FREET4, T3FREE, THYROIDAB in the last 72 hours. Anemia Panel: No results for input(s): VITAMINB12, FOLATE, FERRITIN, TIBC, IRON, RETICCTPCT in the last 72 hours. Sepsis Labs: Recent Labs  Lab 09/06/21 0022  LATICACIDVEN 1.9     Recent Results (from the past 240 hour(s))  Blood culture (routine x 2)     Status: None (Preliminary result)   Collection Time: 09/05/21 11:05 PM   Specimen: BLOOD LEFT FOREARM  Result Value Ref Range Status   Specimen Description BLOOD LEFT FOREARM  Final   Special Requests   Final    BOTTLES DRAWN AEROBIC AND ANAEROBIC Blood Culture adequate volume   Culture   Final    NO GROWTH 3 DAYS Performed at [facility], [address], [place], [place] [address]    Report Status PENDING  Incomplete  Resp Panel by RT-PCR (Flu A&B, Covid)     Status: None   Collection Time: 09/05/21 11:13 PM   Specimen: Nasopharyngeal(NP) swabs in vial transport medium  Result Value Ref Range Status   SARS Coronavirus 2 by RT PCR NEGATIVE NEGATIVE Final    Comment: (NOTE) SARS-CoV-2 target nucleic acids are NOT DETECTED.  The SARS-CoV-2 RNA is generally detectable in upper respiratory specimens during the acute phase of infection. The lowest concentration of SARS-CoV-2 viral copies this assay can detect is 138 copies/mL. A negative result  does not preclude SARS-Cov-2 infection and should not be used as the sole basis for treatment or other patient management decisions. A negative result may occur with  improper specimen collection/handling, submission of specimen other than nasopharyngeal swab, presence of viral mutation(s) within the areas targeted by this assay, and inadequate number of viral copies(<138 copies/mL). A negative result must be combined with clinical observations, patient history, and epidemiological information. The expected result is Negative.  Fact Sheet for Patients:   EntrepreneurPulse.com.au  Fact Sheet for Healthcare Providers:  IncredibleEmployment.be  This test is no t yet approved or cleared by the [place] FDA and  has been authorized for detection and/or diagnosis of SARS-CoV-2 by FDA under an Emergency Use Authorization (EUA). This EUA will remain  in effect (meaning this test can be used) for the duration of the COVID-19 declaration under Section 564(b)(1) of the Act, 21 U.S.C.section 360bbb-3(b)(1), unless the authorization is terminated  or revoked sooner.       Influenza A by PCR NEGATIVE NEGATIVE Final   Influenza B by PCR NEGATIVE NEGATIVE Final    Comment: (NOTE) The Xpert Xpress SARS-CoV-2/FLU/RSV plus assay is intended as an aid in the diagnosis of influenza from Nasopharyngeal swab specimens and should not be used as a sole basis for treatment. Nasal washings and aspirates are unacceptable for Xpert Xpress SARS-CoV-2/FLU/RSV testing.  Fact Sheet for Patients: EntrepreneurPulse.com.au  Fact Sheet for Healthcare Providers: IncredibleEmployment.be  This test is not yet approved or cleared by the [place] FDA and has been authorized for detection and/or diagnosis of SARS-CoV-2 by FDA under an Emergency Use Authorization (EUA). This EUA will remain in effect (meaning this test can be used) for the duration of the COVID-19 declaration under Section 564(b)(1) of the Act, 21 U.S.C. section 360bbb-3(b)(1), unless the authorization is terminated or revoked.  Performed at [facility], [place] [address], [place], [place] [address]   Blood culture (routine x 2)     Status: None (Preliminary result)   Collection Time: 09/05/21 11:14 PM   Specimen: BLOOD RIGHT HAND  Result Value Ref Range Status   Specimen Description BLOOD RIGHT HAND  Final   Special Requests   Final    BOTTLES DRAWN AEROBIC AND ANAEROBIC Blood Culture results may not be optimal  due to an inadequate volume of blood received in culture bottles   Culture   Final    NO GROWTH 3 DAYS Performed at [facility], [place] [address] [place], [place] [address]    Report Status PENDING  Incomplete  Urine Culture     Status: Abnormal   Collection Time: 09/06/21  4:28 AM   Specimen: Urine, Clean Catch  Result Value Ref Range Status   Specimen Description URINE, CLEAN CATCH  Final   Special Requests   Final    NONE Performed at [facility], [place] [address], [place], [place] [address]    Culture (A)  Final    >=100,000 COLONIES/mL STAPHYLOCOCCUS EPIDERMIDIS >=100,000 COLONIES/mL PSEUDOMONAS AERUGINOSA    Report Status 09/09/2021 FINAL  Final   Organism ID, Bacteria STAPHYLOCOCCUS EPIDERMIDIS (A)  Final   Organism ID, Bacteria PSEUDOMONAS AERUGINOSA (A)  Final      Susceptibility   Pseudomonas aeruginosa - MIC*    CEFTAZIDIME <=1 SENSITIVE Sensitive     CIPROFLOXACIN 2 RESISTANT Resistant     GENTAMICIN <=1 SENSITIVE Sensitive     IMIPENEM >=16 RESISTANT Resistant     PIP/TAZO <=4 SENSITIVE Sensitive  CEFEPIME 0.5 SENSITIVE Sensitive     * >=100,000 COLONIES/mL PSEUDOMONAS AERUGINOSA   Staphylococcus epidermidis - MIC*    CIPROFLOXACIN <=0.5 SENSITIVE Sensitive     GENTAMICIN <=0.5 SENSITIVE Sensitive     NITROFURANTOIN <=16 SENSITIVE Sensitive     OXACILLIN <=0.25 SENSITIVE Sensitive     TETRACYCLINE 2 SENSITIVE Sensitive     VANCOMYCIN 2 SENSITIVE Sensitive     TRIMETH/SULFA 80 RESISTANT Resistant     CLINDAMYCIN <=0.25 SENSITIVE Sensitive     RIFAMPIN <=0.5 SENSITIVE Sensitive     Inducible Clindamycin NEGATIVE Sensitive     * >=100,000 COLONIES/mL STAPHYLOCOCCUS EPIDERMIDIS      Radiology Studies: EEG adult  Result Date: 27-Sep-2021 Lora Havens, MD     27-Sep-2021  2:41 PM Patient Name: Kenyen Candy Jasperson MRN: [id number] Epilepsy Attending: Lora Havens Referring Physician/Provider: Dr Derrick Ravel Date: September 27, 2021 Duration: 27.05  mins Patient history: 75 year old male with altered mental status.  EEG to evaluate for seizure. Level of alertness: Awake, asleep AEDs during EEG study: None Technical aspects: This EEG study was done with scalp electrodes positioned according to the 10-20 International system of electrode placement. Electrical activity was acquired at a sampling rate of 500Hz  and reviewed with a high frequency filter of 70Hz  and a low frequency filter of 1Hz . EEG data were recorded continuously and digitally stored. Description: The posterior dominant rhythm consists of 8-9 Hz activity of moderate voltage (25-35 uV) seen predominantly in posterior head regions, symmetric and reactive to eye opening and eye closing. Sleep was characterized by vertex waves, sleep spindles (12 to 14 Hz), maximal frontocentral region. Hyperventilation and photic stimulation were not performed.   IMPRESSION: This study is within normal limits. No seizures or epileptiform discharges were seen throughout the recording. Lora Havens     LOS: 3 days   Antonieta Pert, MD [facility]  09/09/2021, 7:38 AM

## 2021-09-09 NOTE — Progress Notes (Signed)
Inpatient Rehabilitation Admissions Coordinator   I met at bedside with patient , son and daughter. They state that he worked with therapy today and that Home with [place] is recommended. I will not pursue [facility] admit . Family from [place] and can provide assistance.  Danne Baxter, RN, MSN Rehab Admissions Coordinator [phone] 09/09/2021 12:42 PM

## 2021-09-09 NOTE — Plan of Care (Signed)

## 2021-09-09 NOTE — Progress Notes (Signed)
Physical Therapy Treatment Patient Details Name: Fernando Blankenship MRN: [id number] DOB: [birth year] Today's Date: 09/09/2021   History of Present Illness Pt is a 75 y.o. M who presents 09/05/2021 with AMS after being found down at home. Admitted with complicated UTI, free fluid in left paracolic gutter, RLE DVT, AKI, hyperkalemia, mild rhabdomyolysis. Significant PMH: CAD s/p PCI, HTN, DM2, prostate CA s/p radioactive seed implant in august 2022.    PT Comments    Pt progressing well towards his physical therapy goals, exhibiting improved balance, activity tolerance and cognition this session. Pt A&Ox4 and problem solving with min cues. Pt ambulating 400 feet with a walker at a min guard assist level. Continue to recommend RW for mobility. Pt family plans to stay with pt initially to provide supervision. Updated d/c plan in light of progress.    Recommendations for follow up therapy are one component of a multi-disciplinary discharge planning process, led by the attending physician.  Recommendations may be updated based on patient status, additional functional criteria and insurance authorization.  Follow Up Recommendations  Home health PT     Assistance Recommended at Discharge Intermittent Supervision/Assistance  Equipment Recommendations  Rolling walker (2 wheels)    Recommendations for Other Services       Precautions / Restrictions Precautions Precautions: Fall Restrictions Weight Bearing Restrictions: No     Mobility  Bed Mobility               General bed mobility comments: OOB in chair    Transfers Overall transfer level: Needs assistance Equipment used: Rolling walker (2 wheels) Transfers: Sit to/from Stand Sit to Stand: Min guard                Ambulation/Gait Ambulation/Gait assistance: Min guard Gait Distance (Feet): 400 Feet Assistive device: Rolling walker (2 wheels) Gait Pattern/deviations: Step-through pattern;Decreased stride length Gait  velocity: decreased     General Gait Details: Slow and steady pace with RW. Able to dual task, perform head turns, and navigate obstacles without gross imbalance. unable to significantly alter gait speed yet   Stairs             Wheelchair Mobility    Modified Rankin (Stroke Patients Only)       Balance Overall balance assessment: Needs assistance Sitting-balance support: Feet supported Sitting balance-Leahy Scale: Good     Standing balance support: No upper extremity supported;During functional activity Standing balance-Leahy Scale: Poor Standing balance comment: Pt able to weight shift without UE support, but unable to lift single LE off ground without holding on                            Cognition Arousal/Alertness: Awake/alert Behavior During Therapy: WFL for tasks assessed/performed Overall Cognitive Status: Impaired/Different from baseline Area of Impairment: Memory;Problem solving                     Memory: Decreased short-term memory       Problem Solving: Requires verbal cues General Comments: Pt requiring min cues for problem solving, able to dual task during mobility        Exercises      General Comments        Pertinent Vitals/Pain Pain Assessment: No/denies pain    Home Living                          Prior Function  PT Goals (current goals can now be found in the care plan section) Acute Rehab PT Goals Patient Stated Goal: did not state PT Goal Formulation: With patient Time For Goal Achievement: 09/22/21 Potential to Achieve Goals: Good Progress towards PT goals: Progressing toward goals    Frequency    Min 3X/week      PT Plan Discharge plan needs to be updated    Co-evaluation              AM-PAC PT "6 Clicks" Mobility   Outcome Measure  Help needed turning from your back to your side while in a flat bed without using bedrails?: None Help needed moving from lying  on your back to sitting on the side of a flat bed without using bedrails?: A Little Help needed moving to and from a bed to a chair (including a wheelchair)?: A Little Help needed standing up from a chair using your arms (e.g., wheelchair or bedside chair)?: A Little Help needed to walk in hospital room?: A Little Help needed climbing 3-5 steps with a railing? : A Little 6 Click Score: 19    End of Session Equipment Utilized During Treatment: Gait belt Activity Tolerance: Patient tolerated treatment well Patient left: in chair;with call bell/phone within reach;with chair alarm set Nurse Communication: Mobility status PT Visit Diagnosis: Unsteadiness on feet (R26.81);Muscle weakness (generalized) (M62.81);Difficulty in walking, not elsewhere classified (R26.2)     Time: 1117-1203 PT Time Calculation (min) (ACUTE ONLY): 46 min  Charges:  $Therapeutic Activity: 38-52 mins                     Wyona Almas, PT, DPT Acute Rehabilitation Services Pager [phone] Office [phone]    Deno Etienne 09/09/2021, 2:45 PM

## 2021-09-10 LAB — URINALYSIS, MICROSCOPIC (REFLEX)
RBC / HPF: >50 RBC/hpf (ref 0–5)
WBC, UA: >50 WBC/hpf (ref 0–5)

## 2021-09-10 LAB — COMPREHENSIVE METABOLIC PANEL
ALT: 63 U/L — ABNORMAL HIGH (ref 0–44)
AST: 49 U/L — ABNORMAL HIGH (ref 15–41)
Albumin: 2.1 g/dL — ABNORMAL LOW (ref 3.5–5.0)
Alkaline Phosphatase: 47 U/L (ref 38–126)
Anion gap: 8 (ref 5–15)
BUN: 18 mg/dL (ref 8–23)
CO2: 25 mmol/L (ref 22–32)
Calcium: 7.7 mg/dL — ABNORMAL LOW (ref 8.9–10.3)
Chloride: 106 mmol/L (ref 98–111)
Creatinine, Ser: 1.13 mg/dL (ref 0.61–1.24)
GFR, Estimated: >60 mL/min (ref >60)
Glucose, Bld: 172 mg/dL — ABNORMAL HIGH (ref 70–99)
Potassium: 3.4 mmol/L — ABNORMAL LOW (ref 3.5–5.1)
Sodium: 139 mmol/L (ref 135–145)
Total Bilirubin: 0.6 mg/dL (ref 0.3–1.2)
Total Protein: 4.7 g/dL — ABNORMAL LOW (ref 6.5–8.1)

## 2021-09-10 LAB — URINALYSIS, ROUTINE W REFLEX MICROSCOPIC
Bilirubin Urine: NEGATIVE
Glucose, UA: >=500 mg/dL — AB
Ketones, ur: NEGATIVE mg/dL
Nitrite: NEGATIVE
Protein, ur: NEGATIVE mg/dL
Specific Gravity, Urine: 1.015 (ref 1.005–1.030)
pH: 6 (ref 5.0–8.0)

## 2021-09-10 LAB — CULTURE, BLOOD (ROUTINE X 2)
Culture: NO GROWTH
Culture: NO GROWTH
Special Requests: ADEQUATE

## 2021-09-10 LAB — GLUCOSE, CAPILLARY
Glucose-Capillary: 167 mg/dL — ABNORMAL HIGH (ref 70–99)
Glucose-Capillary: 189 mg/dL — ABNORMAL HIGH (ref 70–99)
Glucose-Capillary: 196 mg/dL — ABNORMAL HIGH (ref 70–99)
Glucose-Capillary: 216 mg/dL — ABNORMAL HIGH (ref 70–99)

## 2021-09-10 LAB — CBC
HCT: 33 % — ABNORMAL LOW (ref 39.0–52.0)
Hemoglobin: 10.8 g/dL — ABNORMAL LOW (ref 13.0–17.0)
MCH: 30.2 pg (ref 26.0–34.0)
MCHC: 32.7 g/dL (ref 30.0–36.0)
MCV: 92.2 fL (ref 80.0–100.0)
Platelets: 186 10*3/uL (ref 150–400)
RBC: 3.58 MIL/uL — ABNORMAL LOW (ref 4.22–5.81)
RDW: 16 % — ABNORMAL HIGH (ref 11.5–15.5)
WBC: 6.2 10*3/uL (ref 4.0–10.5)
nRBC: 0 % (ref 0.0–0.2)

## 2021-09-10 LAB — CK: Total CK: 265 U/L (ref 49–397)

## 2021-09-10 MED ORDER — POTASSIUM CHLORIDE CRYS ER 20 MEQ PO TBCR
40.0000 meq | EXTENDED_RELEASE_TABLET | Freq: Once | ORAL | Status: AC
  Administered 2021-09-10: 40 meq via ORAL
  Filled 2021-09-10: qty 2

## 2021-09-10 NOTE — Plan of Care (Signed)
°  Problem: Activity: °Goal: Risk for activity intolerance will decrease °Outcome: Progressing °  °Problem: Elimination: °Goal: Will not experience complications related to bowel motility °Outcome: Progressing °Goal: Will not experience complications related to urinary retention °Outcome: Progressing °  °

## 2021-09-10 NOTE — Progress Notes (Signed)
PROGRESS NOTE    Fernando Blankenship  JQB:[id number] DOB: 08/28/1946 DOA: 09/05/2021 PCP: Lavone Orn, MD   Chief Complaint  Patient presents with   Altered Mental Status  Brief Narrative/Hospital Course: Fernando Blankenship, 75 y.o. male with PMH of  CAD status post PCI in 2014, hypertension, hyperlipidemia, GERD, non-insulin-dependent type 2 diabetes, prostate cancer status post radioactive seed implant in August 2022, RLS, BPH presented to the ED via EMS for evaluation of altered mental status.  Patient lives by himself was seen 2 to 3 days prior to admission, was not answering call and had to break into residents and was found down on the ground incontinent of urine and feces, was confused delirious and hypoxic on room air placed on oxygen and brought to the ED  In the ED, hypoxic to 80s on room air, lab with leukocytosis 17 K, hemoglobin 14.4 and dropped to 12.6 after fluid boluses, mild hyperkalemia, was also febrile 103.5 tachycardic, tachypneic.  Chest x-ray no pneumonia CT head no acute finding,CT scan abdomen with features of cystitis, nonspecific fluid collection in the left pericolic gutter suspicious for hemorrhage, EDP consulted the trauma surgery, patient was also placed on bicarb infusion and heparin drip as pulmonary embolism is being ruled out.  Chest x-ray unremarkable. Patient has managed with IV antibiotics IV fluid hydrations, his renal function is slowly improved and normalized.  He remains deconditioned and weak frail and confused and mental status has improved.  PT OT has suggested rehab placement. He had easily retention and at home he does intermittent self-catheterization up to 10 times.  Discussed with his urologist and planning to continue Foley catheter for now and he will need outpatient follow-up At this time planning for inpatient rehabilitation- On PT OT to eval patient has clinically improved and recommending home health  Subjective: Seen this morning.  Resting  comfortably.  Alert and oriented. Overnight no fever Rest of the labs stable  Assessment & Plan:  Severe sepsis POA secondary to Pseudomonas UTI Complicated UTI: At baseline does intermittent self catheterization at home since his prostate cancer treatment urine culture with  staph epidermidis  and Pseudomonas .  Discussed with ID Dr Candiss Norse staph epidermidis is felt to be contamination, continue with Pseudomonas treatment with cefepime x7 days.  Patient has clinically improved, leukocytosis resolved, afebrile.  Mentation improved.  He now has a Foley catheter which will be continued and will be followed by his urologist. Recent Labs  Lab 09/06/21 0022 09/06/21 1441 09/07/21 0349 09/08/21 0552 09/09/21 0341 09/10/21 0449  WBC  --  12.9* 9.4 8.2 6.2 6.2  LATICACIDVEN 1.9  --   --   --   --   --     Acute metabolic encephalopathy due to sepsis UTI: Resolved.  Continue PT OT and supportive care  Right lower extremity DVT, negative VQ scan.  Switched to oral Eliquis 12/14-continue on outpatient follow-up with PCP  BPH Prostate cancer with radioactive seed implanted in August 2022 Dr Dahlstead/Dr Tammi Klippel. 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 Diona Fanti - headvised to keep the Foley catheter in place and he will follow-up as outpatient. Eh will cont home flomax oxybutynin and Solifenacin.   CAD status post PCI in 2014 Demand ischemia/positive troponin 46-44  Troponin flat no delta - from demand ischemia in the setting of sepsis.  Denies any chest pain.  Continue his home aspirin, metoprolol  and continue to hold statin due  to rhabdomyolysis.    Free fluid in left paracolic gutter,unclear etiology: Patient has no abdominal pain or complaint hemoglobin has been notably stable so doubt solid organ injury per trauma surgery.  Doubt perforated viscus no further surgical intervention and  have signed off.  Patient is tolerating diet .  AKI metabolic acidosis Hyperkalemia on presentation: AKI has resolved, metabolic acidosis resolved.  Discontinue IV fluids encourage oral intake  Recent Labs  Lab 09/06/21 1441 09/07/21 0349 09/08/21 0552 09/09/21 0341 09/10/21 0449  BUN 47* 46* 39* 25* 18  CREATININE 2.42* 2.17* 1.47* 1.18 1.13  Mild hypokalemia replete po  Mild rhabdomyolysis was found on the floor.resolved.   Mild transaminitis likely from rhabdomyolysis and sepsis.  Trending down.  GERD continue PPI RLS continues Requip  Diabetes mellitus:well controlled on sliding scale.  Holding home metformin and Jardiance. last A1c stable 6.6 Recent Labs  Lab 09/09/21 0640 09/09/21 1119 09/09/21 1615 09/09/21 2059 09/10/21 0654  GLUCAP 149* 183* 175* 177* 167*    Class II Obesity:Patient's Body mass index is 38.74 kg/m. : Will benefit with PCP follow-up, weight loss  healthy lifestyle and outpatient sleep evaluation.  Deconditioning found on down on the floor: Continue PT OT -inpatient rehab has been consulted.    DVT prophylaxis: heparin  Code Status:   Code Status: Full Code Family Communication: plan of care discussed with patient and daughter and son at the bedside today.   Status is: Inpatient Remains inpatient appropriate because: Due to ongoing management of his renal failure DVT and confusion Disposition: Currently not medically stable for discharge. Anticipated Disposition: Home with home health PT OT after completing antibiotic course over the weekend  Objective: Vitals last 24 hrs: Vitals:   09/09/21 1651 09/09/21 2110 09/10/21 0450 09/10/21 0844  BP: 124/76 133/78 117/67 133/80  Pulse: 72 76 72 80  Resp: 17 18 18 16   Temp: 98.3 F (36.8 C) 98.4 F (36.9 C) 98.1 F (36.7 C) 97.6 F (36.4 C)  TempSrc: Oral Oral Oral Oral  SpO2: 97% 95% 96% 97%  Weight:  112.2 kg    Height:       Weight change:   Intake/Output Summary (Last 24 hours) at  09/10/2021 1008 Last data filed at 09/10/2021 4098 Gross per 24 hour  Intake 1404.39 ml  Output 3150 ml  Net -1745.61 ml   Net IO Since Admission: 715.37 mL [09/10/21 1008]   Physical Examination: General exam: AAOx 3, obese, pleasant,older than stated age, weak appearing. HEENT:Oral mucosa moist, Ear/Nose WNL grossly, dentition normal. Respiratory system: bilaterally clear, no use of accessory muscle Cardiovascular system: S1 & S2 +, No JVD,. Gastrointestinal system: Abdomen soft, NT,ND, BS+ Nervous System:Alert, awake, moving extremities and grossly nonfocal Extremities: No edema, distal peripheral pulses palpable.  Skin: No rashes,no icterus. MSK: Normal muscle bulk,tone, power  Foley catheter in place  Medications reviewed:  Scheduled Meds:  apixaban  10 mg Oral BID   Followed by   Derrill Memo ON 09/16/2021] apixaban  5 mg Oral BID   aspirin  81 mg Oral Daily   Chlorhexidine Gluconate Cloth  6 each Topical Daily   darifenacin  7.5 mg Oral Daily   docusate sodium  100 mg Oral BID   insulin aspart  0-5 Units Subcutaneous QHS   insulin aspart  0-9 Units Subcutaneous TID WC   metoprolol succinate  25 mg Oral Daily   multivitamin with minerals  1 tablet Oral Daily   pantoprazole  40 mg Oral Daily   rOPINIRole  1 mg Oral QHS   tamsulosin  0.4 mg Oral BID   Continuous Infusions:  ceFEPime (MAXIPIME) IV 200 mL/hr at 09/10/21 2841   Diet Order             Diet heart healthy/carb modified Room service appropriate? Yes; Fluid consistency: Thin  Diet effective now                   Weight change:   Wt Readings from Last 3 Encounters:  09/09/21 112.2 kg  07/01/21 104.3 kg  05/21/21 112.8 kg     Consultants:see note  Procedures:see note Antimicrobials: Anti-infectives (From admission, onward)    Start     Dose/Rate Route Frequency Ordered Stop   09/08/21 2200  ceFEPIme (MAXIPIME) 2 g in sodium chloride 0.9 % 100 mL IVPB        2 g 200 mL/hr over 30 Minutes  Intravenous Every 12 hours 09/08/21 1625     09/07/21 1200  ceFEPIme (MAXIPIME) 2 g in sodium chloride 0.9 % 100 mL IVPB  Status:  Discontinued        2 g 200 mL/hr over 30 Minutes Intravenous Every 24 hours 09/06/21 0104 09/08/21 1625   09/06/21 1435  vancomycin variable dose per unstable renal function (pharmacist dosing)  Status:  Discontinued         Does not apply See admin instructions 09/06/21 1435 09/06/21 1443   09/05/21 2330  vancomycin (VANCOREADY) IVPB 2000 mg/400 mL        2,000 mg 200 mL/hr over 120 Minutes Intravenous  Once 09/05/21 2318 09/06/21 0227   09/05/21 2315  ceFEPIme (MAXIPIME) 2 g in sodium chloride 0.9 % 100 mL IVPB        2 g 200 mL/hr over 30 Minutes Intravenous  Once 09/05/21 2312 09/06/21 0004   09/05/21 2315  metroNIDAZOLE (FLAGYL) IVPB 500 mg        500 mg 100 mL/hr over 60 Minutes Intravenous  Once 09/05/21 2312 09/06/21 0023   09/05/21 2315  vancomycin (VANCOCIN) IVPB 1000 mg/200 mL premix  Status:  Discontinued        1,000 mg 200 mL/hr over 60 Minutes Intravenous  Once 09/05/21 2312 09/05/21 2318      Culture/Microbiology    Component Value Date/Time   SDES URINE, CLEAN CATCH 09/06/2021 0428   SPECREQUEST  09/06/2021 0428    NONE Performed at [facility], [place] [address], [place], [place] [address]    CULT (A) 09/06/2021 0428    >=100,000 COLONIES/mL STAPHYLOCOCCUS EPIDERMIDIS >=100,000 COLONIES/mL PSEUDOMONAS AERUGINOSA    REPTSTATUS 09/09/2021 FINAL 09/06/2021 0428    Other culture-see note  Unresulted Labs (From admission, onward)    None     Data Reviewed: I have personally reviewed following labs and imaging studies CBC: Recent Labs  Lab 09/05/21 2225 09/06/21 0239 09/06/21 1441 09/07/21 0349 09/08/21 0552 09/09/21 0341 09/10/21 0449  WBC 17.0*  --  12.9* 9.4 8.2 6.2 6.2  NEUTROABS 15.0*  --   --   --   --   --   --   HGB 14.4   < > 12.5* 10.2* 10.3* 10.1* 10.8*  HCT 44.3   < > 38.5* 32.1* 31.1* 32.1* 33.0*  MCV  92.1  --  93.2 93.3 90.7 93.0 92.2  PLT 160  --  152 138* 137* 148* 186   < > = values in this interval not displayed.   Basic Metabolic Panel: Recent Labs  Lab 09/06/21 1441  09/07/21 0349 09/08/21 0552 09/09/21 0341 09/10/21 0449  NA 134* 134* 136 134* 139  K 3.4* 3.3* 3.3* 3.4* 3.4*  CL 97* 98 103 105 106  CO2 27 26 26  21* 25  GLUCOSE 165* 184* 162* 166* 172*  BUN 47* 46* 39* 25* 18  CREATININE 2.42* 2.17* 1.47* 1.18 1.13  CALCIUM 8.2* 7.9* 7.7* 7.3* 7.7*   GFR: Estimated Creatinine Clearance: 67.5 mL/min (by C-G formula based on SCr of 1.13 mg/dL). Liver Function Tests: Recent Labs  Lab 09/06/21 1441 09/07/21 0349 09/08/21 0552 09/09/21 0341 09/10/21 0449  AST 147* 101* 68* 79* 49*  ALT 50* 47* 48* 69* 63*  ALKPHOS 62 39 41 46 47  BILITOT 2.2* 1.5* 0.8 0.7 0.6  PROT 5.7* 5.2* 5.1* 5.1* 4.7*  ALBUMIN 2.5* 2.3* 2.1* 2.1* 2.1*   No results for input(s): LIPASE, AMYLASE in the last 168 hours. No results for input(s): AMMONIA in the last 168 hours. Coagulation Profile: No results for input(s): INR, PROTIME in the last 168 hours. Cardiac Enzymes: Recent Labs  Lab 09/06/21 1441 09/07/21 0349 09/08/21 0552 09/09/21 0341 09/10/21 0449  CKTOTAL 5,530* 2,625* 839* 555* 265   BNP (last 3 results) No results for input(s): PROBNP in the last 8760 hours. HbA1C: No results for input(s): HGBA1C in the last 72 hours.  CBG: Recent Labs  Lab 09/09/21 0640 09/09/21 1119 09/09/21 1615 09/09/21 2059 09/10/21 0654  GLUCAP 149* 183* 175* 177* 167*   Lipid Profile: No results for input(s): CHOL, HDL, LDLCALC, TRIG, CHOLHDL, LDLDIRECT in the last 72 hours. Thyroid Function Tests: No results for input(s): TSH, T4TOTAL, FREET4, T3FREE, THYROIDAB in the last 72 hours. Anemia Panel: No results for input(s): VITAMINB12, FOLATE, FERRITIN, TIBC, IRON, RETICCTPCT in the last 72 hours. Sepsis Labs: Recent Labs  Lab 09/06/21 0022  LATICACIDVEN 1.9    Recent Results (from  the past 240 hour(s))  Blood culture (routine x 2)     Status: None   Collection Time: 09/05/21 11:05 PM   Specimen: BLOOD LEFT FOREARM  Result Value Ref Range Status   Specimen Description BLOOD LEFT FOREARM  Final   Special Requests   Final    BOTTLES DRAWN AEROBIC AND ANAEROBIC Blood Culture adequate volume   Culture   Final    NO GROWTH 5 DAYS Performed at [facility], [address] [place], [place] [address]    Report Status 09/10/2021 FINAL  Final  Resp Panel by RT-PCR (Flu A&B, Covid)     Status: None   Collection Time: 09/05/21 11:13 PM   Specimen: Nasopharyngeal(NP) swabs in vial transport medium  Result Value Ref Range Status   SARS Coronavirus 2 by RT PCR NEGATIVE NEGATIVE Final    Comment: (NOTE) SARS-CoV-2 target nucleic acids are NOT DETECTED.  The SARS-CoV-2 RNA is generally detectable in upper respiratory specimens during the acute phase of infection. The lowest concentration of SARS-CoV-2 viral copies this assay can detect is 138 copies/mL. A negative result does not preclude SARS-Cov-2 infection and should not be used as the sole basis for treatment or other patient management decisions. A negative result may occur with  improper specimen collection/handling, submission of specimen other than nasopharyngeal swab, presence of viral mutation(s) within the areas targeted by this assay, and inadequate number of viral copies(<138 copies/mL). A negative result must be combined with clinical observations, patient history, and epidemiological information. The expected result is Negative.  Fact Sheet for Patients:  EntrepreneurPulse.com.au  Fact Sheet for Healthcare Providers:  IncredibleEmployment.be  This test is no t yet approved or cleared by the [place] and  has been authorized for detection and/or diagnosis of SARS-CoV-2 by FDA under an Emergency Use Authorization (EUA). This EUA will remain  in effect  (meaning this test can be used) for the duration of the COVID-19 declaration under Section 564(b)(1) of the Act, 21 U.S.C.section 360bbb-3(b)(1), unless the authorization is terminated  or revoked sooner.       Influenza A by PCR NEGATIVE NEGATIVE Final   Influenza B by PCR NEGATIVE NEGATIVE Final    Comment: (NOTE) The Xpert Xpress SARS-CoV-2/FLU/RSV plus assay is intended as an aid in the diagnosis of influenza from Nasopharyngeal swab specimens and should not be used as a sole basis for treatment. Nasal washings and aspirates are unacceptable for Xpert Xpress SARS-CoV-2/FLU/RSV testing.  Fact Sheet for Patients: EntrepreneurPulse.com.au  Fact Sheet for Healthcare Providers: IncredibleEmployment.be  This test is not yet approved or cleared by the [place] FDA and has been authorized for detection and/or diagnosis of SARS-CoV-2 by FDA under an Emergency Use Authorization (EUA). This EUA will remain in effect (meaning this test can be used) for the duration of the COVID-19 declaration under Section 564(b)(1) of the Act, 21 U.S.C. section 360bbb-3(b)(1), unless the authorization is terminated or revoked.  Performed at [facility], [place] [address], [place], [place] [address]   Blood culture (routine x 2)     Status: None   Collection Time: 09/05/21 11:14 PM   Specimen: BLOOD RIGHT HAND  Result Value Ref Range Status   Specimen Description BLOOD RIGHT HAND  Final   Special Requests   Final    BOTTLES DRAWN AEROBIC AND ANAEROBIC Blood Culture results may not be optimal due to an inadequate volume of blood received in culture bottles   Culture   Final    NO GROWTH 5 DAYS Performed at [facility], [place] [address], [place], [place] [address]    Report Status 09/10/2021 FINAL  Final  Urine Culture     Status: Abnormal   Collection Time: 09/06/21  4:28 AM   Specimen: Urine, Clean Catch  Result Value Ref Range Status    Specimen Description URINE, CLEAN CATCH  Final   Special Requests   Final    NONE Performed at [facility], [place] [address] [place], [place] [address]    Culture (A)  Final    >=100,000 COLONIES/mL STAPHYLOCOCCUS EPIDERMIDIS >=100,000 COLONIES/mL PSEUDOMONAS AERUGINOSA    Report Status 09/09/2021 FINAL  Final   Organism ID, Bacteria STAPHYLOCOCCUS EPIDERMIDIS (A)  Final   Organism ID, Bacteria PSEUDOMONAS AERUGINOSA (A)  Final      Susceptibility   Pseudomonas aeruginosa - MIC*    CEFTAZIDIME <=1 SENSITIVE Sensitive     CIPROFLOXACIN 2 RESISTANT Resistant     GENTAMICIN <=1 SENSITIVE Sensitive     IMIPENEM >=16 RESISTANT Resistant     PIP/TAZO <=4 SENSITIVE Sensitive     CEFEPIME 0.5 SENSITIVE Sensitive     * >=100,000 COLONIES/mL PSEUDOMONAS AERUGINOSA   Staphylococcus epidermidis - MIC*    CIPROFLOXACIN <=0.5 SENSITIVE Sensitive     GENTAMICIN <=0.5 SENSITIVE Sensitive     NITROFURANTOIN <=16 SENSITIVE Sensitive     OXACILLIN <=0.25 SENSITIVE Sensitive     TETRACYCLINE 2 SENSITIVE Sensitive     VANCOMYCIN 2 SENSITIVE Sensitive     TRIMETH/SULFA 80 RESISTANT Resistant     CLINDAMYCIN <=0.25 SENSITIVE Sensitive     RIFAMPIN <=0.5  SENSITIVE Sensitive     Inducible Clindamycin NEGATIVE Sensitive     * >=100,000 COLONIES/mL STAPHYLOCOCCUS EPIDERMIDIS      Radiology Studies: No results found.   LOS: 4 days   Antonieta Pert, MD [facility]  09/10/2021, 10:08 AM

## 2021-09-10 NOTE — Progress Notes (Signed)
Occupational Therapy Treatment Patient Details Name: Fernando Blankenship MRN: [id number] DOB: 1946/06/17 Today's Date: 09/10/2021   History of present illness Pt is a 75 y.o. M who presents 09/05/2021 with AMS after being found down at home. Admitted with complicated UTI, free fluid in left paracolic gutter, RLE DVT, AKI, hyperkalemia, mild rhabdomyolysis. Significant PMH: CAD s/p PCI, HTN, DM2, prostate CA s/p radioactive seed implant in august 2022.   OT comments  Pt making good progress with functional goals. Cognition has improved, but pt still requires some cues for problem solving. Session focused on UB and LB ADLs, standing from chair to RW for ambulation to bathroom for toilet transfers, toileting tasks, standing at sink for grooming and hygiene tasks. Now recommending home with [facility] therapies; pt's family will be able to stay with him for a few weeks to assist prn. OT will continue to follow acutely to maximize level of function and safety   Recommendations for follow up therapy are one component of a multi-disciplinary discharge planning process, led by the attending physician.  Recommendations may be updated based on patient status, additional functional criteria and insurance authorization.    Follow Up Recommendations  Home health OT    Assistance Recommended at Discharge Frequent or constant Supervision/Assistance  Equipment Recommendations  Other (comment) (RW, shower chair)    Recommendations for Other Services      Precautions / Restrictions Precautions Precautions: Fall Restrictions Weight Bearing Restrictions: No       Mobility Bed Mobility               General bed mobility comments: OOB in chair    Transfers Overall transfer level: Needs assistance Equipment used: Rolling walker (2 wheels) Transfers: Sit to/from Stand Sit to Stand: Min guard                 Balance Overall balance assessment: Needs assistance Sitting-balance support: Feet  supported Sitting balance-Leahy Scale: Good Sitting balance - Comments: Able to doff/donn socks seated in chair with increased time and supervision   Standing balance support: No upper extremity supported;During functional activity Standing balance-Leahy Scale: Poor                             ADL either performed or assessed with clinical judgement   ADL Overall ADL's : Needs assistance/impaired     Grooming: Wash/dry hands;Wash/dry face;Min guard;Standing       Lower Body Bathing: Minimal assistance;Min guard;Sitting/lateral leans;Sit to/from stand Lower Body Bathing Details (indicate cue type and reason): simulated seated EOB, sit - stand at [employer] Details (indicate cue type and reason): Sup to don socks seated in chair with increased time required to complete. Pt hyperverbose and easily distracted Toilet Transfer: Min guard;Ambulation;Rolling walker (2 wheels);Cueing for safety;BSC/3in1;Grab bars   Toileting- Clothing Manipulation and Hygiene: Minimal assistance;Sit to/from stand       Functional mobility during ADLs: Min guard;Cueing for safety;Rolling walker (2 wheels)      Extremity/Trunk Assessment Upper Extremity Assessment Upper Extremity Assessment: Overall WFL for tasks assessed   Lower Extremity Assessment Lower Extremity Assessment: Defer to PT evaluation   Cervical / Trunk Assessment Cervical / Trunk Assessment: Normal    Vision Baseline Vision/History: 1 Wears glasses Ability to See in Adequate Light: 0 Adequate Patient Visual Report: No change from baseline     Perception     Praxis  Cognition Arousal/Alertness: Awake/alert Behavior During Therapy: WFL for tasks assessed/performed Overall Cognitive Status: Impaired/Different from baseline Area of Impairment: Memory;Problem solving                     Memory: Decreased short-term memory       Problem Solving: Requires verbal cues General  Comments: Pt requiring min cues for problem solving          Exercises     Shoulder Instructions       General Comments      Pertinent Vitals/ Pain       Pain Assessment: No/denies pain  Home Living                                          Prior Functioning/Environment              Frequency  Min 2X/week        Progress Toward Goals  OT Goals(current goals can now be found in the care plan section)  Progress towards OT goals: Progressing toward goals     Plan Discharge plan remains appropriate    Co-evaluation                 AM-PAC OT "6 Clicks" Daily Activity     Outcome Measure   Help from another person eating meals?: None Help from another person taking care of personal grooming?: A Little Help from another person toileting, which includes using toliet, bedpan, or urinal?: A Little Help from another person bathing (including washing, rinsing, drying)?: A Little Help from another person to put on and taking off regular upper body clothing?: A Little Help from another person to put on and taking off regular lower body clothing?: A Little 6 Click Score: 19    End of Session Equipment Utilized During Treatment: Gait belt;Rolling walker (2 wheels);Other (comment) (3 in 1 over toilet)  OT Visit Diagnosis: Unsteadiness on feet (R26.81);Other abnormalities of gait and mobility (R26.89);History of falling (Z91.81);Muscle weakness (generalized) (M62.81);Other symptoms and signs involving cognitive function   Activity Tolerance Patient tolerated treatment well   Patient Left in chair;with call bell/phone within reach;with chair alarm set   Nurse Communication          Time: 9211-9417 OT Time Calculation (min): 26 min  Charges: OT General Charges $OT Visit: 1 Visit OT Treatments $Self Care/Home Management : 8-22 mins $Therapeutic Activity: 8-22 mins    Britt Bottom 09/10/2021, 2:11 PM

## 2021-09-11 DIAGNOSIS — A4152 Sepsis due to Pseudomonas: Secondary | ICD-10-CM

## 2021-09-11 LAB — GLUCOSE, CAPILLARY
Glucose-Capillary: 159 mg/dL — ABNORMAL HIGH (ref 70–99)
Glucose-Capillary: 193 mg/dL — ABNORMAL HIGH (ref 70–99)
Glucose-Capillary: 174 mg/dL — ABNORMAL HIGH (ref 70–99)
Glucose-Capillary: 259 mg/dL — ABNORMAL HIGH (ref 70–99)

## 2021-09-11 MED ORDER — ATORVASTATIN CALCIUM 10 MG PO TABS
20.0000 mg | ORAL_TABLET | Freq: Every day | ORAL | Status: DC
  Administered 2021-09-12: 20 mg via ORAL
  Filled 2021-09-11 (×2): qty 2

## 2021-09-11 NOTE — Progress Notes (Signed)
Physical Therapy Treatment Patient Details Name: Fernando Blankenship MRN: [id number] DOB: [birth year] Today's Date: 09/11/2021   History of Present Illness Pt is a 75 y.o. M who presents 09/05/2021 with AMS after being found down at home. Admitted with complicated UTI, free fluid in left paracolic gutter, RLE DVT, AKI, hyperkalemia, mild rhabdomyolysis. Significant PMH: CAD s/p PCI, HTN, DM2, prostate CA s/p radioactive seed implant in august 2022.    PT Comments    Pt tolerates treatment well, ambulating for increased distances at this time. Pt reports continued feelings of imbalance, preferring to utilize RW, and does experience one lateral loss of balance when distracted during ambulation. Pt will benefit from continued aggressive mobilization to improve balance and to restore independence.  Recommendations for follow up therapy are one component of a multi-disciplinary discharge planning process, led by the attending physician.  Recommendations may be updated based on patient status, additional functional criteria and insurance authorization.  Follow Up Recommendations  Home health PT     Assistance Recommended at Discharge Intermittent Supervision/Assistance  Equipment Recommendations  Rolling walker (2 wheels)    Recommendations for Other Services       Precautions / Restrictions Precautions Precautions: Fall Restrictions Weight Bearing Restrictions: No     Mobility  Bed Mobility Overal bed mobility: Modified Independent Bed Mobility: Supine to Sit     Supine to sit: Modified independent (Device/Increase time)     General bed mobility comments: increased time    Transfers Overall transfer level: Needs assistance Equipment used: Rolling walker (2 wheels) Transfers: Sit to/from Stand Sit to Stand: Supervision                Ambulation/Gait Ambulation/Gait assistance: Supervision Gait Distance (Feet): 800 Feet Assistive device: Rolling walker (2 wheels) Gait  Pattern/deviations: Step-through pattern Gait velocity: reduced Gait velocity interpretation: <1.8 ft/sec, indicate of risk for recurrent falls   General Gait Details: pt with slowed step-through gait, one lateral loss of balance which pt corrects for with UE support of walker.   Stairs             Wheelchair Mobility    Modified Rankin (Stroke Patients Only)       Balance Overall balance assessment: Needs assistance Sitting-balance support: No upper extremity supported;Feet supported Sitting balance-Leahy Scale: Good     Standing balance support: Bilateral upper extremity supported;Reliant on assistive device for balance Standing balance-Leahy Scale: Poor                              Cognition Arousal/Alertness: Awake/alert Behavior During Therapy: WFL for tasks assessed/performed Overall Cognitive Status: Impaired/Different from baseline Area of Impairment: Memory                     Memory: Decreased short-term memory                  Exercises      General Comments General comments (skin integrity, edema, etc.): VSS on RA      Pertinent Vitals/Pain Pain Assessment: No/denies pain    Home Living                          Prior Function            PT Goals (current goals can now be found in the care plan section) Acute Rehab PT Goals Patient Stated Goal: did not state Progress towards  PT goals: Progressing toward goals    Frequency    Min 3X/week      PT Plan Current plan remains appropriate    Co-evaluation              AM-PAC PT "6 Clicks" Mobility   Outcome Measure  Help needed turning from your back to your side while in a flat bed without using bedrails?: None Help needed moving from lying on your back to sitting on the side of a flat bed without using bedrails?: None Help needed moving to and from a bed to a chair (including a wheelchair)?: A Little Help needed standing up from a chair  using your arms (e.g., wheelchair or bedside chair)?: A Little Help needed to walk in hospital room?: A Little Help needed climbing 3-5 steps with a railing? : A Little 6 Click Score: 20    End of Session   Activity Tolerance: Patient tolerated treatment well Patient left: in chair;with call bell/phone within reach;with family/visitor present Nurse Communication: Mobility status PT Visit Diagnosis: Unsteadiness on feet (R26.81);Muscle weakness (generalized) (M62.81);Difficulty in walking, not elsewhere classified (R26.2)     Time: 7412-8786 PT Time Calculation (min) (ACUTE ONLY): 20 min  Charges:  $Gait Training: 8-22 mins                     Zenaida Niece, PT, DPT Acute Rehabilitation Pager: [phone] Office [facility] Gurjit Loconte 09/11/2021, 11:25 AM

## 2021-09-11 NOTE — Progress Notes (Signed)
PROGRESS NOTE    Fernando Blankenship  RFF:[id number] DOB: 1945-12-31 DOA: 09/05/2021 PCP: Lavone Orn, MD   Chief Complaint  Patient presents with   Altered Mental Status  Brief Narrative/Hospital Course: Fernando Blankenship, 75 y.o. male with PMH of  CAD status post PCI in 2014, hypertension, hyperlipidemia, GERD, non-insulin-dependent type 2 diabetes, prostate cancer status post radioactive seed implant in August 2022, RLS, BPH presented to the ED via EMS for evaluation of altered mental status.  Patient lives by himself was seen 2 to 3 days prior to admission, was not answering call and had to break into residents and was found down on the ground incontinent of urine and feces, was confused delirious and hypoxic on room air placed on oxygen and brought to the ED  In the ED, hypoxic to 80s on room air, lab with leukocytosis 17 K, hemoglobin 14.4 and dropped to 12.6 after fluid boluses, mild hyperkalemia, was also febrile 103.5 tachycardic, tachypneic.  Chest x-ray no pneumonia CT head no acute finding,CT scan abdomen with features of cystitis, nonspecific fluid collection in the left pericolic gutter suspicious for hemorrhage, EDP consulted the trauma surgery, patient was also placed on bicarb infusion and heparin drip as pulmonary embolism is being ruled out.  Chest x-ray unremarkable. Patient has managed with IV antibiotics IV fluid hydrations, his renal function is slowly improved and normalized.  He remains deconditioned and weak frail and confused and mental status has improved.  PT OT has suggested rehab placement. He had easily retention and at home he does intermittent self-catheterization up to 10 times.  Discussed with his urologist and planning to continue Foley catheter for now and he will need outpatient follow-up At this time planning for inpatient rehabilitation- On PT OT to eval patient has clinically improved and recommending home health  Subjective: Seen and examined this morning.   Resting comfortably. Afebrile overnight 12/15 Pinkish urine in the Foley catheter Alert awake oriented, urine is clear.  Assessment & Plan:  Severe sepsis POA secondary to Pseudomonas UTI Complicated UTI: At baseline using ISC at home x 10 since his prostate cancer treatment urine culture with  staph epidermidis  and Pseudomonas .  Discussed with ID Dr Candiss Norse staph epidermidis is felt to be contamination, continue with Pseudomonas treatment with cefepime x7 days.  Clinically improved afebrile leukocytosis resolved.  Continue Foley catheter completed IV antibiotics in-house  will be followed by his urologist. Recent Labs  Lab 09/06/21 0022 09/06/21 1441 09/07/21 0349 09/08/21 0552 09/09/21 0341 09/10/21 0449  WBC  --  12.9* 9.4 8.2 6.2 6.2  LATICACIDVEN 1.9  --   --   --   --   --     Acute metabolic encephalopathy due to sepsis UTI: Resolved.    Right lower extremity DVT, negative VQ scan Switched to oral Eliquis 12/14-continue on outpatient follow-up with PCP 3 to 6 months we will do the same.  BPH Prostate cancer with radioactive seed implanted in August 2022 Dr Dahlstead/Dr Tammi Klippel. 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 Diona Fanti - advised to keep the Foley catheter in place and he will follow-up as outpatient.  Continue with flomax oxybutynin and Solifenacin.   CAD status post PCI in 2014 Demand ischemia/positive troponin 46-44  Troponin flat no delta - from demand ischemia in the setting of sepsis.  No episode of chest pain.  Continue on home aspirin, metoprolol, resume statins and Ck improved  Free  fluid in left paracolic gutter,unclear etiology: Patient has no abdominal pain or complaint hemoglobin has been notably stable so doubt solid organ injury per trauma surgery.  Doubt perforated viscus no further surgical intervention and have signed off.  Patient is tolerating  diet .  AKI metabolic acidosis Hyperkalemia on presentation: AKI and metabolic acidosis resolved.  Off IV fluids encourage oral intake Recent Labs  Lab 09/06/21 1441 09/07/21 0349 09/08/21 0552 09/09/21 0341 09/10/21 0449  BUN 47* 46* 39* 25* 18  CREATININE 2.42* 2.17* 1.47* 1.18 1.13  Mild hypokalemia repleted.  Mild rhabdomyolysis atraumatic, was found on the floor.resolved.   Mild transaminitis likely from rhabdomyolysis and sepsis.  Resolved GERD continue PPI RLS continues Requip  Diabetes mellitus: Controlled on sliding scale insulin. Holding home metformin and Jardiance. last A1c stable 6.6 Recent Labs  Lab 09/10/21 0654 09/10/21 1114 09/10/21 1622 09/10/21 2113 09/11/21 0637  GLUCAP 167* 189* 196* 216* 159*    Class II Obesity:Patient's Body mass index is 38.74 kg/m. : Will benefit with PCP follow-up, weight loss  healthy lifestyle and outpatient sleep evaluation.  Deconditioning found on down on the floor: Continue PT OT -inpatient rehab has been consulted.    DVT prophylaxis: heparin  Code Status:   Code Status: Full Code Family Communication: plan of care discussed with patient and daughter and son at the bedside today.   Status is: Inpatient Remains inpatient appropriate because: Due to ongoing management of his renal failure DVT and confusion Disposition: Currently not medically stable for discharge. Anticipated Disposition: Home with home health PT OT tomorrow after completing IV antibiotics  Objective: Vitals last 24 hrs: Vitals:   09/10/21 1708 09/10/21 2111 09/11/21 0503 09/11/21 0928  BP: 107/65 116/68 123/71 101/62  Pulse: 79 82 74 100  Resp: 18 18 18 18   Temp: (!) 97.5 F (36.4 C) 98.4 F (36.9 C) 97.6 F (36.4 C) 97.7 F (36.5 C)  TempSrc: Oral Oral Oral Oral  SpO2: 93% 98% 97% 93%  Weight:      Height:       Weight change:   Intake/Output Summary (Last 24 hours) at 09/11/2021 1004 Last data filed at 09/11/2021 0935 Gross per 24  hour  Intake 786.9 ml  Output 2876 ml  Net -2089.1 ml   Net IO Since Admission: -1,373.73 mL [09/11/21 1004]   Physical Examination: General exam: AAOx 3, pleasant, not in distress HEENT:Oral mucosa moist, Ear/Nose WNL grossly, dentition normal. Respiratory system: bilaterally clear breath sounds, no use of accessory muscle Cardiovascular system: S1 & S2 +, No JVD,. Gastrointestinal system: Abdomen soft, NT,ND, BS+ Nervous System:Alert, awake, moving extremities and grossly nonfocal Extremities: no edema, distal peripheral pulses palpable.  Skin: No rashes,no icterus. MSK: Normal muscle bulk,tone, power  Foley in place  Medications reviewed:  Scheduled Meds:  apixaban  10 mg Oral BID   Followed by   Derrill Memo ON 09/16/2021] apixaban  5 mg Oral BID   aspirin  81 mg Oral Daily   [START ON 09/12/2021] atorvastatin  20 mg Oral Daily   Chlorhexidine Gluconate Cloth  6 each Topical Daily   darifenacin  7.5 mg Oral Daily   docusate sodium  100 mg Oral BID   insulin aspart  0-5 Units Subcutaneous QHS   insulin aspart  0-9 Units Subcutaneous TID WC   metoprolol succinate  25 mg Oral Daily   multivitamin with minerals  1 tablet Oral Daily   pantoprazole  40 mg Oral Daily   rOPINIRole  1 mg Oral  QHS   tamsulosin  0.4 mg Oral BID   Continuous Infusions:  ceFEPime (MAXIPIME) IV Stopped (09/10/21 2300)   Diet Order             Diet heart healthy/carb modified Room service appropriate? Yes; Fluid consistency: Thin  Diet effective now                   Weight change:   Wt Readings from Last 3 Encounters:  09/09/21 112.2 kg  07/01/21 104.3 kg  05/21/21 112.8 kg     Consultants:see note  Procedures:see note Antimicrobials: Anti-infectives (From admission, onward)    Start     Dose/Rate Route Frequency Ordered Stop   09/08/21 2200  ceFEPIme (MAXIPIME) 2 g in sodium chloride 0.9 % 100 mL IVPB        2 g 200 mL/hr over 30 Minutes Intravenous Every 12 hours 09/08/21 1625      09/07/21 1200  ceFEPIme (MAXIPIME) 2 g in sodium chloride 0.9 % 100 mL IVPB  Status:  Discontinued        2 g 200 mL/hr over 30 Minutes Intravenous Every 24 hours 09/06/21 0104 09/08/21 1625   09/06/21 1435  vancomycin variable dose per unstable renal function (pharmacist dosing)  Status:  Discontinued         Does not apply See admin instructions 09/06/21 1435 09/06/21 1443   09/05/21 2330  vancomycin (VANCOREADY) IVPB 2000 mg/400 mL        2,000 mg 200 mL/hr over 120 Minutes Intravenous  Once 09/05/21 2318 09/06/21 0227   09/05/21 2315  ceFEPIme (MAXIPIME) 2 g in sodium chloride 0.9 % 100 mL IVPB        2 g 200 mL/hr over 30 Minutes Intravenous  Once 09/05/21 2312 09/06/21 0004   09/05/21 2315  metroNIDAZOLE (FLAGYL) IVPB 500 mg        500 mg 100 mL/hr over 60 Minutes Intravenous  Once 09/05/21 2312 09/06/21 0023   09/05/21 2315  vancomycin (VANCOCIN) IVPB 1000 mg/200 mL premix  Status:  Discontinued        1,000 mg 200 mL/hr over 60 Minutes Intravenous  Once 09/05/21 2312 09/05/21 2318      Culture/Microbiology    Component Value Date/Time   SDES URINE, CLEAN CATCH 09/06/2021 0428   SPECREQUEST  09/06/2021 0428    NONE Performed at [facility], [place] [address], [place], [place] [address]    CULT (A) 09/06/2021 0428    >=100,000 COLONIES/mL STAPHYLOCOCCUS EPIDERMIDIS >=100,000 COLONIES/mL PSEUDOMONAS AERUGINOSA    REPTSTATUS 09/09/2021 FINAL 09/06/2021 0428    Other culture-see note  Unresulted Labs (From admission, onward)    None     Data Reviewed: I have personally reviewed following labs and imaging studies CBC: Recent Labs  Lab 09/05/21 2225 09/06/21 0239 09/06/21 1441 09/07/21 0349 09/08/21 0552 09/09/21 0341 09/10/21 0449  WBC 17.0*  --  12.9* 9.4 8.2 6.2 6.2  NEUTROABS 15.0*  --   --   --   --   --   --   HGB 14.4   < > 12.5* 10.2* 10.3* 10.1* 10.8*  HCT 44.3   < > 38.5* 32.1* 31.1* 32.1* 33.0*  MCV 92.1  --  93.2 93.3 90.7 93.0 92.2  PLT 160   --  152 138* 137* 148* 186   < > = values in this interval not displayed.   Basic Metabolic Panel: Recent Labs  Lab 09/06/21 1441 09/07/21 0349 09/08/21 0552 09/09/21  0341 09/10/21 0449  NA 134* 134* 136 134* 139  K 3.4* 3.3* 3.3* 3.4* 3.4*  CL 97* 98 103 105 106  CO2 27 26 26  21* 25  GLUCOSE 165* 184* 162* 166* 172*  BUN 47* 46* 39* 25* 18  CREATININE 2.42* 2.17* 1.47* 1.18 1.13  CALCIUM 8.2* 7.9* 7.7* 7.3* 7.7*   GFR: Estimated Creatinine Clearance: 67.5 mL/min (by C-G formula based on SCr of 1.13 mg/dL). Liver Function Tests: Recent Labs  Lab 09/06/21 1441 09/07/21 0349 09/08/21 0552 09/09/21 0341 09/10/21 0449  AST 147* 101* 68* 79* 49*  ALT 50* 47* 48* 69* 63*  ALKPHOS 62 39 41 46 47  BILITOT 2.2* 1.5* 0.8 0.7 0.6  PROT 5.7* 5.2* 5.1* 5.1* 4.7*  ALBUMIN 2.5* 2.3* 2.1* 2.1* 2.1*   No results for input(s): LIPASE, AMYLASE in the last 168 hours. No results for input(s): AMMONIA in the last 168 hours. Coagulation Profile: No results for input(s): INR, PROTIME in the last 168 hours. Cardiac Enzymes: Recent Labs  Lab 09/06/21 1441 09/07/21 0349 09/08/21 0552 09/09/21 0341 09/10/21 0449  CKTOTAL 5,530* 2,625* 839* 555* 265   BNP (last 3 results) No results for input(s): PROBNP in the last 8760 hours. HbA1C: No results for input(s): HGBA1C in the last 72 hours.  CBG: Recent Labs  Lab 09/10/21 0654 09/10/21 1114 09/10/21 1622 09/10/21 2113 09/11/21 0637  GLUCAP 167* 189* 196* 216* 159*   Lipid Profile: No results for input(s): CHOL, HDL, LDLCALC, TRIG, CHOLHDL, LDLDIRECT in the last 72 hours. Thyroid Function Tests: No results for input(s): TSH, T4TOTAL, FREET4, T3FREE, THYROIDAB in the last 72 hours. Anemia Panel: No results for input(s): VITAMINB12, FOLATE, FERRITIN, TIBC, IRON, RETICCTPCT in the last 72 hours. Sepsis Labs: Recent Labs  Lab 09/06/21 0022  LATICACIDVEN 1.9    Recent Results (from the past 240 hour(s))  Blood culture  (routine x 2)     Status: None   Collection Time: 09/05/21 11:05 PM   Specimen: BLOOD LEFT FOREARM  Result Value Ref Range Status   Specimen Description BLOOD LEFT FOREARM  Final   Special Requests   Final    BOTTLES DRAWN AEROBIC AND ANAEROBIC Blood Culture adequate volume   Culture   Final    NO GROWTH 5 DAYS Performed at [facility], [address], [place], [place] [address]    Report Status 09/10/2021 FINAL  Final  Resp Panel by RT-PCR (Flu A&B, Covid)     Status: None   Collection Time: 09/05/21 11:13 PM   Specimen: Nasopharyngeal(NP) swabs in vial transport medium  Result Value Ref Range Status   SARS Coronavirus 2 by RT PCR NEGATIVE NEGATIVE Final    Comment: (NOTE) SARS-CoV-2 target nucleic acids are NOT DETECTED.  The SARS-CoV-2 RNA is generally detectable in upper respiratory specimens during the acute phase of infection. The lowest concentration of SARS-CoV-2 viral copies this assay can detect is 138 copies/mL. A negative result does not preclude SARS-Cov-2 infection and should not be used as the sole basis for treatment or other patient management decisions. A negative result may occur with  improper specimen collection/handling, submission of specimen other than nasopharyngeal swab, presence of viral mutation(s) within the areas targeted by this assay, and inadequate number of viral copies(<138 copies/mL). A negative result must be combined with clinical observations, patient history, and epidemiological information. The expected result is Negative.  Fact Sheet for Patients:  EntrepreneurPulse.com.au  Fact Sheet for Healthcare Providers:  IncredibleEmployment.be  This test is  no t yet approved or cleared by the [place] and  has been authorized for detection and/or diagnosis of SARS-CoV-2 by FDA under an Emergency Use Authorization (EUA). This EUA will remain  in effect (meaning this test can be used) for the  duration of the COVID-19 declaration under Section 564(b)(1) of the Act, 21 U.S.C.section 360bbb-3(b)(1), unless the authorization is terminated  or revoked sooner.       Influenza A by PCR NEGATIVE NEGATIVE Final   Influenza B by PCR NEGATIVE NEGATIVE Final    Comment: (NOTE) The Xpert Xpress SARS-CoV-2/FLU/RSV plus assay is intended as an aid in the diagnosis of influenza from Nasopharyngeal swab specimens and should not be used as a sole basis for treatment. Nasal washings and aspirates are unacceptable for Xpert Xpress SARS-CoV-2/FLU/RSV testing.  Fact Sheet for Patients: EntrepreneurPulse.com.au  Fact Sheet for Healthcare Providers: IncredibleEmployment.be  This test is not yet approved or cleared by the [place] FDA and has been authorized for detection and/or diagnosis of SARS-CoV-2 by FDA under an Emergency Use Authorization (EUA). This EUA will remain in effect (meaning this test can be used) for the duration of the COVID-19 declaration under Section 564(b)(1) of the Act, 21 U.S.C. section 360bbb-3(b)(1), unless the authorization is terminated or revoked.  Performed at [facility], [place] [address], [place], [place] [address]   Blood culture (routine x 2)     Status: None   Collection Time: 09/05/21 11:14 PM   Specimen: BLOOD RIGHT HAND  Result Value Ref Range Status   Specimen Description BLOOD RIGHT HAND  Final   Special Requests   Final    BOTTLES DRAWN AEROBIC AND ANAEROBIC Blood Culture results may not be optimal due to an inadequate volume of blood received in culture bottles   Culture   Final    NO GROWTH 5 DAYS Performed at [facility], [place] [address] [place],  [address]    Report Status 09/10/2021 FINAL  Final  Urine Culture     Status: Abnormal   Collection Time: 09/06/21  4:28 AM   Specimen: Urine, Clean Catch  Result Value Ref Range Status   Specimen Description URINE, CLEAN CATCH  Final    Special Requests   Final    NONE Performed at [facility], [place] [address], [place],  [address]    Culture (A)  Final    >=100,000 COLONIES/mL STAPHYLOCOCCUS EPIDERMIDIS >=100,000 COLONIES/mL PSEUDOMONAS AERUGINOSA    Report Status 09/09/2021 FINAL  Final   Organism ID, Bacteria STAPHYLOCOCCUS EPIDERMIDIS (A)  Final   Organism ID, Bacteria PSEUDOMONAS AERUGINOSA (A)  Final      Susceptibility   Pseudomonas aeruginosa - MIC*    CEFTAZIDIME <=1 SENSITIVE Sensitive     CIPROFLOXACIN 2 RESISTANT Resistant     GENTAMICIN <=1 SENSITIVE Sensitive     IMIPENEM >=16 RESISTANT Resistant     PIP/TAZO <=4 SENSITIVE Sensitive     CEFEPIME 0.5 SENSITIVE Sensitive     * >=100,000 COLONIES/mL PSEUDOMONAS AERUGINOSA   Staphylococcus epidermidis - MIC*    CIPROFLOXACIN <=0.5 SENSITIVE Sensitive     GENTAMICIN <=0.5 SENSITIVE Sensitive     NITROFURANTOIN <=16 SENSITIVE Sensitive     OXACILLIN <=0.25 SENSITIVE Sensitive     TETRACYCLINE 2 SENSITIVE Sensitive     VANCOMYCIN 2 SENSITIVE Sensitive     TRIMETH/SULFA 80 RESISTANT Resistant     CLINDAMYCIN <=0.25 SENSITIVE Sensitive     RIFAMPIN <=0.5 SENSITIVE Sensitive  Inducible Clindamycin NEGATIVE Sensitive     * >=100,000 COLONIES/mL STAPHYLOCOCCUS EPIDERMIDIS      Radiology Studies: No results found.   LOS: 5 days   Antonieta Pert, MD [facility]  09/11/2021, 10:04 AM

## 2021-09-11 NOTE — Progress Notes (Signed)
Pharmacy Antibiotic Note  Fernando Blankenship is a 75 y.o. male on day # 6 Cefepime for fever with unknown source/ sepsis coverage. Urine culture grew Pseudomonas and Staph epidermidis, likely contaminant.     Rhabdo and AKI resolved. Cefepime q12h remains appropriate for renal function and infection. 7 days of treatment to complete 12/17  Plan:  Continue Cefepime 2gm IV q12hrs.  Noted plan for discharge after antibiotics completed 12/17.  Height: 5\' 7"  (170.2 cm) Weight: 112.2 kg (247 lb 5.7 oz) IBW/kg (Calculated) : 66.1  Temp (24hrs), Avg:97.8 F (36.6 C), Min:97.5 F (36.4 C), Max:98.4 F (36.9 C)  Recent Labs  Lab 09/06/21 0022 09/06/21 0349 09/06/21 1441 09/07/21 0349 09/08/21 0552 09/09/21 0341 09/10/21 0449  WBC  --   --  12.9* 9.4 8.2 6.2 6.2  CREATININE  --    < > 2.42* 2.17* 1.47* 1.18 1.13  LATICACIDVEN 1.9  --   --   --   --   --   --    < > = values in this interval not displayed.     Estimated Creatinine Clearance: 67.5 mL/min (by C-G formula based on SCr of 1.13 mg/dL).    Allergies  Allergen Reactions   Prozac [Fluoxetine] Anaphylaxis   Other Other (See Comments)    Plavix cause his lips to swell    Antimicrobials this admission:  Cefepime 12/10 (2321) >> (12/17)  Flagyl 12/10 x 1 (2319)  Vanc 12/11 x 1 (0012)  Dose adjustments this admission: 12/13: creatinine 3.43>>1.47:  Cefepime 2gm IV q24h > q12h  Microbiology results: 12/10 Blood: negative 12/11 urine: reincubated 12/12, now with >100 K/ml Staph epi (contaminant) and Pseudomonas, sensitive to Cefepime 12/10 COVID and flu: neg  Thank you for allowing pharmacy to be a part of this patients care.  Arty Baumgartner, [place] 09/11/2021 1:37 PM

## 2021-09-12 LAB — GLUCOSE, CAPILLARY
Glucose-Capillary: 212 mg/dL — ABNORMAL HIGH (ref 70–99)
Glucose-Capillary: 199 mg/dL — ABNORMAL HIGH (ref 70–99)

## 2021-09-12 MED ORDER — APIXABAN 5 MG PO TABS: ORAL_TABLET | ORAL | 0 refills | Status: DC

## 2021-09-12 NOTE — Progress Notes (Signed)
DISCHARGE NOTE HOME [facility] Edgett Schechter to be discharged Home per MD order. Discussed prescriptions and follow up appointments with the patient. Prescriptions given to patient; medication list explained in detail. Patient verbalized understanding.  Skin clean, dry and intact without evidence of skin break down, no evidence of skin tears noted. IV catheter discontinued intact. Site without signs and symptoms of complications. Dressing and pressure applied. Pt denies pain at the site currently. No complaints noted.  Patient free of lines, drains, and wounds.   An After Visit Summary (AVS) was printed and given to the patient. Patient escorted via wheelchair, and discharged home via private auto.  Samuell Knoble S Callen Zuba, RN

## 2021-09-12 NOTE — TOC Transition Note (Signed)
Transition of Care [facility]) - CM/SW Discharge Note   Patient Details  Name: COALTON ARCH MRN: [id number] Date of Birth: [birth year]  Transition of Care [facility]) CM/SW Contact:  Bartholomew Crews, RN Phone Number: [phone] 09/12/2021, 1:43 PM   Clinical Narrative:     Acknowledging [facility] consult for Eliquis savings card for 30-day free trial and $10 copay. Savings cards provided to patient at bedside - advised to give savings cards to pharmacist. Discussed that copay card is for commercial plans.   Final next level of care: [place] Barriers to Discharge: No Barriers Identified   Patient Goals and CMS Choice Patient states their goals for this hospitalization and ongoing recovery are:: go home CMS Medicare.gov Compare Post Acute Care list provided to:: Patient Choice offered to / list presented to : Patient  Discharge Placement                       Discharge Plan and Services                DME Arranged: [place] DME Agency: [employer] Date DME Agency Contacted: 09/12/21 Time DME Agency Contacted: 57 Representative spoke with at DME Agency: [place]: PT [place]: [place] Date [place]: 09/12/21 Time [place]: 1148 Representative spoke with at [place] (referral approved)  Social Determinants of Health (SDOH) Interventions     Readmission Risk Interventions No flowsheet data found.

## 2021-09-12 NOTE — TOC Transition Note (Signed)
Transition of Care [facility]) - CM/SW Discharge Note   Patient Details  Name: Fernando Blankenship MRN: [id number] Date of Birth: 1946/02/03  Transition of Care [facility]) CM/SW Contact:  Bartholomew Crews, RN Phone Number: [phone] 09/12/2021, 11:48 AM   Clinical Narrative:     Received notification from [facility] at [place] that referral for [facility] has been approved.   Final next level of care: [place] Barriers to Discharge: No Barriers Identified   Patient Goals and CMS Choice Patient states their goals for this hospitalization and ongoing recovery are:: go home CMS Medicare.gov Compare Post Acute Care list provided to:: Patient Choice offered to / list presented to : Patient  Discharge Placement                       Discharge Plan and Services                DME Arranged: [place] DME Agency: [employer] Date DME Agency Contacted: 09/12/21 Time DME Agency Contacted: 47 Representative spoke with at DME Agency: [place]: PT Secretary: [place] Date [place]: 09/12/21 Time [place]: 1148 Representative spoke with at [place] (referral approved)  Social Determinants of Health (SDOH) Interventions     Readmission Risk Interventions No flowsheet data found.

## 2021-09-12 NOTE — Discharge Summary (Signed)
Physician Discharge Summary  Cheston Coury Stefano QVZ:[id number] DOB: September 01, 1946 DOA: 09/05/2021  PCP: Lavone Orn, MD  Admit date: 09/05/2021 Discharge date: 09/12/2021  Admitted From: Home Disposition: Home with home health  Recommendations for Outpatient Follow-up:  Follow up with PCP in 1-2 weeks Please obtain BMP/CBC in one week Please follow up on the following pending results:  Home Health: PT Equipment/Devices: RW   Discharge Condition: Stable Code Status:   Code Status: Full Code Diet recommendation:  Diet Order             Diet heart healthy/carb modified Room service appropriate? Yes; Fluid consistency: Thin  Diet effective now                 Brief/Interim Summary: 75 y.o. male with PMH of  CAD status post PCI in 2014, hypertension, hyperlipidemia, GERD, non-insulin-dependent type 2 diabetes, prostate cancer status post radioactive seed implant in August 2022, RLS, BPH presented to the ED via EMS for evaluation of altered mental status.   Patient lives by himself was seen 2 to 3 days prior to admission, was not answering call and had to break into residents and was found down on the ground incontinent of urine and feces, was confused delirious and hypoxic on room air placed on oxygen and brought to the ED   In the ED, hypoxic to 80s on room air, lab with leukocytosis 17 K, hemoglobin 14.4 and dropped to 12.6 after fluid boluses, mild hyperkalemia, was also febrile 103.5 tachycardic, tachypneic.  Chest x-ray no pneumonia CT head no acute finding,CT scan abdomen with features of cystitis, nonspecific fluid collection in the left pericolic gutter suspicious for hemorrhage, EDP consulted the trauma surgery, patient was also placed on bicarb infusion and heparin drip as pulmonary embolism is being ruled out.  Chest x-ray unremarkable. Patient has managed with IV antibiotics IV fluid hydrations, his renal function is slowly improved and normalized.  He remains deconditioned and  weak frail and confused and mental status has improved.  PT OT has suggested rehab placement. He had easily retention and at home he does intermittent self-catheterization up to 10 times.  Discussed with his urologist and planning to continue Foley catheter for now and he will need outpatient follow-up At this time planning for inpatient rehabilitation- BUT On PT OT to re-eval patient has clinically improved and recommending home health. Patient with completing antibiotics today and will be discharged home  Discharge Diagnoses:  Severe sepsis POA secondary to Pseudomonas UTI Complicated UTI: At baseline using ISC at home x 10 since his prostate cancer treatment urine culture with  staph epidermidis  and Pseudomonas .  Discussed with ID Dr Candiss Norse staph epidermidis is felt to be contamination, continue with Pseudomonas treatment with cefepime and patient will be completing antibiotics today. He is clinically improved afebrile leukocytosis resolved.  Continue Foley catheter completed IV antibiotics in-house  will be followed by his urologist  Acute metabolic encephalopathy due to sepsis UTI: Resolved.     Right lower extremity DVT, negative VQ scan Switched to oral Eliquis 12/14-continue on outpatient follow-up with PCP.    BPH Prostate cancer with radioactive seed implanted in August 2022 Dr Dahlstead/Dr Tammi Klippel. 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 Diona Fanti - advised to keep the Foley catheter in place and he will follow-up as outpatient.  Continue with flomax oxybutynin and Solifenacin.    CAD status post PCI in 2014 Demand ischemia/positive troponin 46-44  Troponin flat no delta - from demand ischemia in the setting of sepsis.  No episode of chest pain.  Continue home aspirin, metoprolol, and statinFree fluid in left paracolic gutter,unclear etiology: Patient has no abdominal pain or  complaint hemoglobin has been notably stable so doubt solid organ injury per trauma surgery.  Doubt perforated viscus no further surgical intervention and have signed off.  Patient is tolerating diet .   AKI metabolic acidosis Hyperkalemia on presentation: AKI and metabolic acidosis resolved.  Off IV fluids encourage oral intake.  Mild hypokalemia repleted.  Mild rhabdomyolysis atraumatic, was found on the floor.resolved.   Mild transaminitis likely from rhabdomyolysis and sepsis.  Resolved GERD continue PPI RLS continues Requip   Diabetes mellitus: resume home metformin and Jardiance discharge. last A1c stable 6.6 Recent Labs  Lab 09/11/21 0637 09/11/21 1120 09/11/21 1629 09/11/21 2137 09/12/21 0640  GLUCAP 159* 193* 174* 259* 212*    Class II Obesity:Patient's Body mass index is 38.74 kg/m. : Will benefit with PCP follow-up, weight loss  healthy lifestyle and outpatient sleep evaluation.   Deconditioning found on down on the floor: Continue PT OT -inpatient rehab has been consulted.   Consults: Infectious disease and urology over the phone  Subjective: Alert awake afebrile overnight.  No new complaints.  Feels ready for home today.  Discharge Exam: Vitals:   09/12/21 0429 09/12/21 0915  BP: 116/63 120/60  Pulse: 77 82  Resp: 19 18  Temp: 98.3 F (36.8 C) 97.7 F (36.5 C)  SpO2: 97% 97%   General: Pt is alert, awake, not in acute distress Cardiovascular: RRR, S1/S2 +, no rubs, no gallops Respiratory: CTA bilaterally, no wheezing, no rhonchi Abdominal: Soft, NT, ND, bowel sounds + Extremities: no edema, no cyanosis  Discharge Instructions  Discharge Instructions     Discharge instructions   Complete by: As directed    Follow-up with PCP check CBC and BMP in 1 week Follow-up with your urologist Dr. Diona Fanti regarding your Foley catheter plan and care  Please call call MD or return to ER for similar or worsening recurring problem that brought you to  hospital or if any fever,nausea/vomiting,abdominal pain, uncontrolled pain, chest pain,  shortness of breath or any other alarming symptoms.  Please follow-up your doctor as instructed in a week time and call the office for appointment.  Please avoid alcohol, smoking, or any other illicit substance and maintain healthy habits including taking your regular medications as prescribed.  You were cared for by a hospitalist during your hospital stay. If you have any questions about your discharge medications or the care you received while you were in the hospital after you are discharged, you can call the unit and ask to speak with the hospitalist on call if the hospitalist that took care of you is not available.  Once you are discharged, your primary care physician will handle any further medical issues. Please note that NO REFILLS for any discharge medications will be authorized once you are discharged, as it is imperative that you return to your primary care physician (or establish a relationship with a primary care physician if you do not have one) for your aftercare needs so that they can reassess your need for medications and monitor your lab values   Increase activity slowly   Complete by: As directed       Allergies as of 09/12/2021       Reactions   Prozac [fluoxetine] Anaphylaxis   Other Other (See Comments)   Plavix  cause his lips to swell        Medication List     STOP taking these medications    sulfamethoxazole-trimethoprim 800-160 MG tablet Commonly known as: BACTRIM DS       TAKE these medications    acetaminophen 500 MG tablet Commonly known as: TYLENOL Take 500 mg by mouth every 6 (six) hours as needed.   Alpha-Lipoic Acid 100 MG Caps Take 100 mg by mouth daily.   apixaban 5 MG Tabs tablet Commonly known as: ELIQUIS 10 mg BID until 09/15/21, then 5 mg bid from 09/16/21 am   aspirin 81 MG chewable tablet Chew 1 tablet (81 mg total) by mouth daily.    atorvastatin 20 MG tablet Commonly known as: LIPITOR Take 1 tablet (20 mg total) by mouth daily.   Dexcom G6 Receiver Devi USE AS DIRECTED TO MONITOR BLOOD SUGAR   empagliflozin 25 MG Tabs tablet Commonly known as: JARDIANCE Take 25 mg by mouth daily.   metFORMIN 500 MG 24 hr tablet Commonly known as: GLUCOPHAGE-XR Take 500-1,000 mg by mouth See admin instructions. 1000 mg  in the morning  500 mg in the evening   metoprolol succinate 25 MG 24 hr tablet Commonly known as: TOPROL-XL Take 1 tablet (25 mg total) by mouth daily.   MULTIVITAMINS PO 1 tablet   nitroGLYCERIN 0.4 MG SL tablet Commonly known as: NITROSTAT TAKE 1 TABLET AS NEEDED AS DIRECTED What changed: See the new instructions.   OneTouch Verio test strip Generic drug: glucose blood 2 (two) times daily.   oxybutynin 5 MG tablet Commonly known as: DITROPAN Take 5 mg by mouth as needed for bladder spasms.   pantoprazole 40 MG tablet Commonly known as: PROTONIX Take 40 mg by mouth daily.   rOPINIRole 1 MG tablet Commonly known as: REQUIP Take 1 mg by mouth at bedtime.   solifenacin 10 MG tablet Commonly known as: VESICARE Take 10 mg by mouth daily.   tamsulosin 0.4 MG Caps capsule Commonly known as: FLOMAX Take 0.4 mg by mouth 2 (two) times daily.   triamterene-hydrochlorothiazide 37.5-25 MG tablet Commonly known as: MAXZIDE-25 Take 1 tablet by mouth every morning.               Durable Medical Equipment  (From admission, onward)           Start     Ordered   09/12/21 1009  DME Walker  Once       Question Answer Comment  Walker: With [employer] Wheels   Patient needs a walker to treat with the following condition Physical deconditioning      09/12/21 1009            Follow-up Information     Lavone Orn, MD Follow up in 1 week(s).   Specialty: Internal Medicine Contact information: [address] [place] [address] [phone]          Franchot Gallo, MD Follow up in 1 week(s).   Specialty: Urology Contact information: [place] [address] [phone]                Allergies  Allergen Reactions   Prozac [Fluoxetine] Anaphylaxis   Other Other (See Comments)    Plavix cause his lips to swell    The results of significant diagnostics from this hospitalization (including imaging, microbiology, ancillary and laboratory) are listed below for reference.    Microbiology: Recent Results (from the past 240 hour(s))  Blood  culture (routine x 2)     Status: None   Collection Time: 09/05/21 11:05 PM   Specimen: BLOOD LEFT FOREARM  Result Value Ref Range Status   Specimen Description BLOOD LEFT FOREARM  Final   Special Requests   Final    BOTTLES DRAWN AEROBIC AND ANAEROBIC Blood Culture adequate volume   Culture   Final    NO GROWTH 5 DAYS Performed at [facility], [address] [place], [place] [address]    Report Status 09/10/2021 FINAL  Final  Resp Panel by RT-PCR (Flu A&B, Covid)     Status: None   Collection Time: 09/05/21 11:13 PM   Specimen: Nasopharyngeal(NP) swabs in vial transport medium  Result Value Ref Range Status   SARS Coronavirus 2 by RT PCR NEGATIVE NEGATIVE Final    Comment: (NOTE) SARS-CoV-2 target nucleic acids are NOT DETECTED.  The SARS-CoV-2 RNA is generally detectable in upper respiratory specimens during the acute phase of infection. The lowest concentration of SARS-CoV-2 viral copies this assay can detect is 138 copies/mL. A negative result does not preclude SARS-Cov-2 infection and should not be used as the sole basis for treatment or other patient management decisions. A negative result may occur with  improper specimen collection/handling, submission of specimen other than nasopharyngeal swab, presence of viral mutation(s) within the areas targeted by this assay, and inadequate number of viral copies(<138 copies/mL). A negative result must be  combined with clinical observations, patient history, and epidemiological information. The expected result is Negative.  Fact Sheet for Patients:  EntrepreneurPulse.com.au  Fact Sheet for Healthcare Providers:  IncredibleEmployment.be  This test is no t yet approved or cleared by the [place] FDA and  has been authorized for detection and/or diagnosis of SARS-CoV-2 by FDA under an Emergency Use Authorization (EUA). This EUA will remain  in effect (meaning this test can be used) for the duration of the COVID-19 declaration under Section 564(b)(1) of the Act, 21 U.S.C.section 360bbb-3(b)(1), unless the authorization is terminated  or revoked sooner.       Influenza A by PCR NEGATIVE NEGATIVE Final   Influenza B by PCR NEGATIVE NEGATIVE Final    Comment: (NOTE) The Xpert Xpress SARS-CoV-2/FLU/RSV plus assay is intended as an aid in the diagnosis of influenza from Nasopharyngeal swab specimens and should not be used as a sole basis for treatment. Nasal washings and aspirates are unacceptable for Xpert Xpress SARS-CoV-2/FLU/RSV testing.  Fact Sheet for Patients: EntrepreneurPulse.com.au  Fact Sheet for Healthcare Providers: IncredibleEmployment.be  This test is not yet approved or cleared by the [place] FDA and has been authorized for detection and/or diagnosis of SARS-CoV-2 by FDA under an Emergency Use Authorization (EUA). This EUA will remain in effect (meaning this test can be used) for the duration of the COVID-19 declaration under Section 564(b)(1) of the Act, 21 U.S.C. section 360bbb-3(b)(1), unless the authorization is terminated or revoked.  Performed at [facility], [place] [address], [place], [place] [address]   Blood culture (routine x 2)     Status: None   Collection Time: 09/05/21 11:14 PM   Specimen: BLOOD RIGHT HAND  Result Value Ref Range Status   Specimen Description  BLOOD RIGHT HAND  Final   Special Requests   Final    BOTTLES DRAWN AEROBIC AND ANAEROBIC Blood Culture results may not be optimal due to an inadequate volume of blood received in culture bottles   Culture   Final    NO GROWTH 5 DAYS  Performed at [facility], [place] [address], [place], [place] [address]    Report Status 09/10/2021 FINAL  Final  Urine Culture     Status: Abnormal   Collection Time: 09/06/21  4:28 AM   Specimen: Urine, Clean Catch  Result Value Ref Range Status   Specimen Description URINE, CLEAN CATCH  Final   Special Requests   Final    NONE Performed at [facility], [place] [address], [place], [place] [address]    Culture (A)  Final    >=100,000 COLONIES/mL STAPHYLOCOCCUS EPIDERMIDIS >=100,000 COLONIES/mL PSEUDOMONAS AERUGINOSA    Report Status 09/09/2021 FINAL  Final   Organism ID, Bacteria STAPHYLOCOCCUS EPIDERMIDIS (A)  Final   Organism ID, Bacteria PSEUDOMONAS AERUGINOSA (A)  Final      Susceptibility   Pseudomonas aeruginosa - MIC*    CEFTAZIDIME <=1 SENSITIVE Sensitive     CIPROFLOXACIN 2 RESISTANT Resistant     GENTAMICIN <=1 SENSITIVE Sensitive     IMIPENEM >=16 RESISTANT Resistant     PIP/TAZO <=4 SENSITIVE Sensitive     CEFEPIME 0.5 SENSITIVE Sensitive     * >=100,000 COLONIES/mL PSEUDOMONAS AERUGINOSA   Staphylococcus epidermidis - MIC*    CIPROFLOXACIN <=0.5 SENSITIVE Sensitive     GENTAMICIN <=0.5 SENSITIVE Sensitive     NITROFURANTOIN <=16 SENSITIVE Sensitive     OXACILLIN <=0.25 SENSITIVE Sensitive     TETRACYCLINE 2 SENSITIVE Sensitive     VANCOMYCIN 2 SENSITIVE Sensitive     TRIMETH/SULFA 80 RESISTANT Resistant     CLINDAMYCIN <=0.25 SENSITIVE Sensitive     RIFAMPIN <=0.5 SENSITIVE Sensitive     Inducible Clindamycin NEGATIVE Sensitive     * >=100,000 COLONIES/mL STAPHYLOCOCCUS EPIDERMIDIS    Procedures/Studies: CT HEAD WO CONTRAST (5MM)  Result Date: 09/05/2021 CLINICAL DATA:  Delirium EXAM: CT HEAD WITHOUT CONTRAST  TECHNIQUE: Contiguous axial images were obtained from the base of the skull through the vertex without intravenous contrast. COMPARISON:  06/23/2004 FINDINGS: Brain: No evidence of acute infarction, hemorrhage, hydrocephalus, extra-axial collection or mass lesion/mass effect. Mild subcortical white matter and periventricular small vessel ischemic changes. Vascular: No hyperdense vessel or unexpected calcification. Skull: Normal. Negative for fracture or focal lesion. Sinuses/Orbits: The visualized paranasal sinuses are essentially clear. The mastoid air cells are unopacified. Other: None. IMPRESSION: No evidence of acute intracranial abnormality. Mild small vessel ischemic changes. Electronically Signed   By: Julian Hy M.D.   On: 09/05/2021 23:38   NM Pulmonary Perfusion  Result Date: 09/06/2021 CLINICAL DATA:  75 year old male with chest pain and chronic cough. Tachycardic. EXAM: NUCLEAR MEDICINE PERFUSION LUNG SCAN TECHNIQUE: Perfusion images were obtained in multiple projections after intravenous injection of radiopharmaceutical. RADIOPHARMACEUTICALS:  4.1 mCi Tc-24m MAA COMPARISON:  CT chest 09/06/2021 FINDINGS: Single linear defect in the posterior LEFT lower lobe seen on posterior projection. This perfusion defect is favored to correspond atelectasis on comparison CT. No wedge-shaped peripheral perfusion defects to suggest acute pulmonary. IMPRESSION: 1. No evidence acute pulmonary embolism. 2. LEFT basilar atelectasis. Electronically Signed   By: Suzy Bouchard M.D.   On: 09/06/2021 13:17   DG Chest Portable 1 View  Result Date: 09/05/2021 CLINICAL DATA:  Altered level of consciousness, history of prostate cancer EXAM: PORTABLE CHEST 1 VIEW COMPARISON:  02/12/2021 FINDINGS: Single frontal view of the chest demonstrates an unremarkable cardiac silhouette. No acute airspace disease, effusion, or pneumothorax. No acute bony abnormalities. IMPRESSION: 1. No acute intrathoracic process.  Electronically Signed   By: Diana Eves.D.  On: 09/05/2021 23:00   EEG adult  Result Date: 09/07/2021 Lora Havens, MD     09/07/2021  2:41 PM Patient Name: Clell Trahan Fill MRN: [id number] Epilepsy Attending: Lora Havens Referring Physician/Provider: Dr Derrick Ravel Date: 09/07/2021 Duration: 27.05 mins Patient history: 75 year old male with altered mental status.  EEG to evaluate for seizure. Level of alertness: Awake, asleep AEDs during EEG study: None Technical aspects: This EEG study was done with scalp electrodes positioned according to the 10-20 International system of electrode placement. Electrical activity was acquired at a sampling rate of 500Hz  and reviewed with a high frequency filter of 70Hz  and a low frequency filter of 1Hz . EEG data were recorded continuously and digitally stored. Description: The posterior dominant rhythm consists of 8-9 Hz activity of moderate voltage (25-35 uV) seen predominantly in posterior head regions, symmetric and reactive to eye opening and eye closing. Sleep was characterized by vertex waves, sleep spindles (12 to 14 Hz), maximal frontocentral region. Hyperventilation and photic stimulation were not performed.   IMPRESSION: This study is within normal limits. No seizures or epileptiform discharges were seen throughout the recording. Lora Havens   ECHOCARDIOGRAM COMPLETE  Result Date: 09/06/2021    ECHOCARDIOGRAM REPORT   Patient Name:   BAILEE THALL Date of Exam: 09/06/2021 Medical Rec #:  [id number]      Height:       67.0 in Accession #:    [id number]     Weight:       230.0 lb Date of Birth:  04/18/1946      BSA:          2.146 m Patient Age:    36 years       BP:           107/70 mmHg Patient Gender: M              HR:           105 bpm. Exam Location:  Inpatient Procedure: 2D Echo, Cardiac Doppler and Color Doppler Indications:    Syncope  History:        Patient has no prior history of Echocardiogram examinations.                  CAD; Risk Factors:Diabetes and Hypertension.  Sonographer:    Maudry Mayhew MHA, RDMS, RVT, RDCS Referring Phys: [id number] [facility]  Sonographer Comments: Image acquisition challenging due to respiratory motion. IMPRESSIONS  1. Left ventricular ejection fraction, by estimation, is 60 to 65%. The left ventricle has normal function. Left ventricular endocardial border not optimally defined to evaluate regional wall motion. Indeterminate diastolic filling due to E-A fusion.  2. Right ventricular systolic function is normal. The right ventricular size is normal. There is normal pulmonary artery systolic pressure. The estimated right ventricular systolic pressure is 8.4 mmHg.  3. The mitral valve is grossly normal. No evidence of mitral valve regurgitation. No evidence of mitral stenosis.  4. The aortic valve is tricuspid. There is mild calcification of the aortic valve. Aortic valve regurgitation is not visualized. Aortic valve sclerosis is present, with no evidence of aortic valve stenosis.  5. The inferior vena cava is normal in size with greater than 50% respiratory variability, suggesting right atrial pressure of 3 mmHg. FINDINGS  Left Ventricle: Left ventricular ejection fraction, by estimation, is 60 to 65%. The left ventricle has normal function. Left ventricular endocardial border not optimally defined to evaluate regional wall motion. The left ventricular internal cavity  size was normal in size. There is no left ventricular hypertrophy. Indeterminate diastolic filling due to E-A fusion. Right Ventricle: The right ventricular size is normal. No increase in right ventricular wall thickness. Right ventricular systolic function is normal. There is normal pulmonary artery systolic pressure. The tricuspid regurgitant velocity is 1.16 m/s, and  with an assumed right atrial pressure of 3 mmHg, the estimated right ventricular systolic pressure is 8.4 mmHg. Left Atrium: Left atrial size was normal in size.  Right Atrium: Right atrial size was normal in size. Pericardium: Trivial pericardial effusion is present. Mitral Valve: The mitral valve is grossly normal. No evidence of mitral valve regurgitation. No evidence of mitral valve stenosis. Tricuspid Valve: The tricuspid valve is grossly normal. Tricuspid valve regurgitation is not demonstrated. No evidence of tricuspid stenosis. Aortic Valve: The aortic valve is tricuspid. There is mild calcification of the aortic valve. Aortic valve regurgitation is not visualized. Aortic valve sclerosis is present, with no evidence of aortic valve stenosis. Aortic valve mean gradient measures 4.0 mmHg. Aortic valve peak gradient measures 8.1 mmHg. Aortic valve area, by VTI measures 2.38 cm. Pulmonic Valve: The pulmonic valve was grossly normal. Pulmonic valve regurgitation is not visualized. No evidence of pulmonic stenosis. Aorta: The aortic root is normal in size and structure. Venous: The inferior vena cava is normal in size with greater than 50% respiratory variability, suggesting right atrial pressure of 3 mmHg. IAS/Shunts: The atrial septum is grossly normal.  LEFT VENTRICLE PLAX 2D LVIDd:         5.30 cm     Diastology LVIDs:         4.70 cm     LV e' medial:    5.77 cm/s LV PW:         0.70 cm     LV E/e' medial:  13.2 LV IVS:        0.55 cm     LV e' lateral:   5.87 cm/s LVOT diam:     1.80 cm     LV E/e' lateral: 13.0 LV SV:         49 LV SV Index:   23 LVOT Area:     2.54 cm    3D Volume EF                            LV 3D EDV:   152.85 ml                            LV 3D ESV:   34.12 ml LV Volumes (MOD) LV vol d, MOD A2C: 52.1 ml LV vol d, MOD A4C: 72.0 ml LV vol s, MOD A2C: 25.4 ml LV vol s, MOD A4C: 32.3 ml LV SV MOD A2C:     26.7 ml LV SV MOD A4C:     72.0 ml LV SV MOD BP:      34.4 ml RIGHT VENTRICLE RV S prime:     14.10 cm/s TAPSE (M-mode): 1.5 cm LEFT ATRIUM             Index        RIGHT ATRIUM           Index LA diam:        2.80 cm 1.30 cm/m   RA Area:      12.50 cm LA Vol (A2C):   44.9 ml 20.92 ml/m  RA Volume:  27.80 ml  12.95 ml/m LA Vol (A4C):   33.0 ml 15.38 ml/m LA Biplane Vol: 40.8 ml 19.01 ml/m  AORTIC VALVE AV Area (Vmax):    2.20 cm AV Area (Vmean):   2.31 cm AV Area (VTI):     2.38 cm AV Vmax:           142.00 cm/s AV Vmean:          93.100 cm/s AV VTI:            0.206 m AV Peak Grad:      8.1 mmHg AV Mean Grad:      4.0 mmHg LVOT Vmax:         123.00 cm/s LVOT Vmean:        84.500 cm/s LVOT VTI:          0.193 m LVOT/AV VTI ratio: 0.94  AORTA Ao Root diam: 3.00 cm MITRAL VALVE                TRICUSPID VALVE MV Area (PHT): 2.50 cm     TR Peak grad:   5.4 mmHg MV Decel Time: 304 msec     TR Vmax:        116.00 cm/s MV E velocity: 76.40 cm/s MV A velocity: 107.00 cm/s  SHUNTS MV E/A ratio:  0.71         Systemic VTI:  0.19 m                             Systemic Diam: 1.80 cm Eleonore Chiquito MD Electronically signed by Eleonore Chiquito MD Signature Date/Time: 09/06/2021/1:20:31 PM    Final    VAS US CAROTID  Result Date: 09/06/2021 Carotid Arterial Duplex Study Patient Name:  LAYNE DILAURO  Date of Exam:   09/06/2021 Medical Rec #: [id number]       Accession #:    [id number] Date of Birth: 1946/07/22       Patient Gender: M Patient Age:   71 years Exam Location:  [facility] Procedure:      VAS US CAROTID Referring Phys: Wandra Feinstein RATHORE --------------------------------------------------------------------------------  Indications:       Syncope. Risk Factors:      Hypertension, Diabetes, past history of smoking, coronary                    artery disease. Comparison Study:  No prior study Performing Technologist: Maudry Mayhew MHA, RDMS, RVT, RDCS  Examination Guidelines: A complete evaluation includes B-mode imaging, spectral Doppler, color Doppler, and power Doppler as needed of all accessible portions of each vessel. Bilateral testing is considered an integral part of a complete examination. Limited examinations for reoccurring  indications may be performed as noted.  Right Carotid Findings: +----------+--------+--------+--------+----------------------------+--------+             PSV cm/s EDV cm/s Stenosis Plaque Description           Comments  +----------+--------+--------+--------+----------------------------+--------+  CCA Prox   83       24                                                       +----------+--------+--------+--------+----------------------------+--------+  CCA Distal 80       14  smooth and heterogenous                +----------+--------+--------+--------+----------------------------+--------+  ICA Prox   82       30                smooth and homogeneous                 +----------+--------+--------+--------+----------------------------+--------+  ICA Distal 113      37                                                       +----------+--------+--------+--------+----------------------------+--------+  ECA        107      21                hyperechoic and heterogenous           +----------+--------+--------+--------+----------------------------+--------+ +----------+--------+-------+----------------+-------------------+             PSV cm/s EDV cms Describe         Arm Pressure (mmHG)  +----------+--------+-------+----------------+-------------------+  Subclavian 97               Multiphasic, WNL                      +----------+--------+-------+----------------+-------------------+ +---------+--------+--+--------+--+---------+  Vertebral PSV cm/s 41 EDV cm/s 13 Antegrade  +---------+--------+--+--------+--+---------+  Left Carotid Findings: +----------+-------+--------+--------+--------------------------------+--------+             PSV     EDV cm/s Stenosis Plaque Description               Comments              cm/s                                                                 +----------+-------+--------+--------+--------------------------------+--------+  CCA Prox   100     24                focal and  heterogenous                     +----------+-------+--------+--------+--------------------------------+--------+  CCA Distal 101     25                                                           +----------+-------+--------+--------+--------------------------------+--------+  ICA Prox   54      19                focal, hyperechoic and                                                           heterogenous                               +----------+-------+--------+--------+--------------------------------+--------+  ICA Distal 84      33                                                           +----------+-------+--------+--------+--------------------------------+--------+  ECA        58      13                heterogenous and hyperechoic               +----------+-------+--------+--------+--------------------------------+--------+ +----------+--------+--------+----------------+-------------------+             PSV cm/s EDV cm/s Describe         Arm Pressure (mmHG)  +----------+--------+--------+----------------+-------------------+  Subclavian 133               Multiphasic, WNL                      +----------+--------+--------+----------------+-------------------+ +---------+--------+--+--------+--+---------+  Vertebral PSV cm/s 46 EDV cm/s 15 Antegrade  +---------+--------+--+--------+--+---------+   Summary: Right Carotid: Velocities in the right ICA are consistent with a 1-39% stenosis. Left Carotid: Velocities in the left ICA are consistent with a 1-39% stenosis. Vertebrals:  Bilateral vertebral arteries demonstrate antegrade flow. Subclavians: Normal flow hemodynamics were seen in bilateral subclavian              arteries. *See table(s) above for measurements and observations.  Electronically signed by Harold Barban MD on 09/06/2021 at 53:46:53 PM.    Final    VAS [place] LOWER EXTREMITY VENOUS (DVT)  Result Date: 09/06/2021  Lower Venous DVT Study Patient Name:  LATRON RIBAS  Date of Exam:   09/06/2021  Medical Rec #: [id number]       Accession #:    [id number] Date of Birth: 09-23-1946       Patient Gender: M Patient Age:   77 years Exam Location:  [facility] Procedure:      VAS [place] LOWER EXTREMITY VENOUS (DVT) Referring Phys: Wandra Feinstein RATHORE --------------------------------------------------------------------------------  Indications: Edema.  Comparison Study: No prior study Performing Technologist: Maudry Mayhew MHA, RDMS, RVT, RDCS  Examination Guidelines: A complete evaluation includes B-mode imaging, spectral Doppler, color Doppler, and power Doppler as needed of all accessible portions of each vessel. Bilateral testing is considered an integral part of a complete examination. Limited examinations for reoccurring indications may be performed as noted. The reflux portion of the exam is performed with the patient in reverse Trendelenburg.  +---------+---------------+---------+-----------+----------+--------------+  RIGHT     Compressibility Phasicity Spontaneity Properties Thrombus Aging  +---------+---------------+---------+-----------+----------+--------------+  CFV       Full            Yes       Yes                                    +---------+---------------+---------+-----------+----------+--------------+  SFJ       Full                                                             +---------+---------------+---------+-----------+----------+--------------+  FV Prox   Full                                                             +---------+---------------+---------+-----------+----------+--------------+  FV Mid    Full                                                             +---------+---------------+---------+-----------+----------+--------------+  FV Distal Full                                                             +---------+---------------+---------+-----------+----------+--------------+  PFV       Full                                                              +---------+---------------+---------+-----------+----------+--------------+  POP       Full            Yes       Yes                                    +---------+---------------+---------+-----------+----------+--------------+  PTV       Full                                                             +---------+---------------+---------+-----------+----------+--------------+  PERO      None                      No                     Acute           +---------+---------------+---------+-----------+----------+--------------+   +---------+---------------+---------+-----------+----------+--------------+  LEFT      Compressibility Phasicity Spontaneity Properties Thrombus Aging  +---------+---------------+---------+-----------+----------+--------------+  CFV       Full            Yes       Yes                                    +---------+---------------+---------+-----------+----------+--------------+  SFJ       Full                                                             +---------+---------------+---------+-----------+----------+--------------+  FV Prox   Full                                                             +---------+---------------+---------+-----------+----------+--------------+  FV Mid    Full                                                             +---------+---------------+---------+-----------+----------+--------------+  FV Distal Full                                                             +---------+---------------+---------+-----------+----------+--------------+  PFV       Full                                                             +---------+---------------+---------+-----------+----------+--------------+  POP       Full            Yes       Yes                                    +---------+---------------+---------+-----------+----------+--------------+  PTV       Full                                                              +---------+---------------+---------+-----------+----------+--------------+  PERO      Full                                                             +---------+---------------+---------+-----------+----------+--------------+     Summary: RIGHT: - Findings consistent with acute deep vein thrombosis involving the right peroneal veins. - No cystic structure found in the popliteal fossa.  LEFT: - There is no evidence of deep vein thrombosis in the lower extremity.  - No cystic structure found in the popliteal fossa.  *See table(s) above for measurements and observations. Electronically signed by Harold Barban MD on 09/06/2021 at 7:47:09 PM.    Final    CT CHEST ABDOMEN PELVIS WO CONTRAST  Result Date: 09/06/2021 CLINICAL DATA:  Found down, altered mental status, abdominal pain EXAM: CT CHEST, ABDOMEN AND PELVIS WITHOUT CONTRAST TECHNIQUE: Multidetector CT imaging of the chest, abdomen and pelvis was performed following the standard protocol without IV contrast. COMPARISON:  Chest radiograph dated 09/05/2021 FINDINGS: CT CHEST FINDINGS Cardiovascular: The heart is normal in size. No pericardial effusion. No evidence of thoracic aortic aneurysm. Atherosclerotic calcifications of the arch. Coronary atherosclerosis of the LAD. Mediastinum/Nodes: No suspicious mediastinal lymphadenopathy. Visualized thyroid is unremarkable. Lungs/Pleura: Mild paraseptal emphysematous changes, upper lung predominant. No suspicious pulmonary nodules. Mild dependent atelectasis in the bilateral lower lobes. No focal consolidation. No pleural effusion or pneumothorax. Musculoskeletal: Degenerative changes of the thoracic spine. CT ABDOMEN PELVIS FINDINGS Hepatobiliary: Unenhanced liver is unremarkable, noting focal fat along the gallbladder fossa. Gallbladder is unremarkable. No intrahepatic or extrahepatic ductal dilatation. Pancreas: Within normal limits. Spleen: Within normal limits. Adrenals/Urinary Tract: Mild nodular thickening of  the right adrenal gland. 14 mm left adrenal nodule (series 3/image 63), indeterminate but likely reflecting a benign adrenal adenoma. Kidneys are within normal limits, noting nonspecific perinephric stranding. No renal, ureteral, or bladder calculi. No hydronephrosis. Thick-walled bladder, correlate for cystitis. Stomach/Bowel: Stomach is within normal limits. No evidence of bowel obstruction. Normal appendix (series 3/image 95). Left colonic diverticulosis, without evidence of diverticulitis. Vascular/Lymphatic: No evidence of abdominal aortic aneurysm. Atherosclerotic calcifications of the abdominal aorta and branch vessels. No suspicious abdominopelvic lymphadenopathy. Reproductive: Brachytherapy seeds along the prostate. Other: Mild complex fluid/hemorrhage along the left pericolic gutter (series 3/image 79). Musculoskeletal: Mild degenerative changes of the lumbar spine. IMPRESSION: Thick-walled bladder, correlate for cystitis. Brachytherapy seeds along the prostate. No findings suspicious for metastatic disease. Mild complex fluid/hemorrhage along the left pericolic gutter, nonspecific. No acute cardiopulmonary abnormality. Electronically Signed   By: Julian Hy M.D.   On: 09/06/2021 02:47    Labs: BNP (last 3 results) Recent Labs    09/05/21 2225  BNP 532.9*   Basic Metabolic Panel: Recent Labs  Lab 09/06/21 1441 09/07/21 0349 09/08/21 0552 09/09/21 0341 09/10/21 0449  NA 134* 134* 136 134* 139  K 3.4* 3.3* 3.3* 3.4* 3.4*  CL 97* 98 103 105 106  CO2 27 26 26  21* 25  GLUCOSE 165* 184* 162* 166* 172*  BUN 47* 46* 39* 25* 18  CREATININE 2.42* 2.17* 1.47* 1.18 1.13  CALCIUM 8.2* 7.9* 7.7* 7.3* 7.7*   Liver Function Tests: Recent Labs  Lab 09/06/21 1441 09/07/21 0349 09/08/21 0552 09/09/21 0341 09/10/21 0449  AST 147* 101* 68* 79* 49*  ALT 50* 47* 48* 69* 63*  ALKPHOS 62 39 41 46 47  BILITOT 2.2* 1.5* 0.8 0.7 0.6  PROT 5.7* 5.2* 5.1* 5.1* 4.7*  ALBUMIN 2.5* 2.3* 2.1*  2.1* 2.1*   No results for input(s): LIPASE, AMYLASE in the last 168 hours. No results for input(s): AMMONIA in the last 168 hours. CBC: Recent Labs  Lab 09/05/21 2225 09/06/21 0239 09/06/21 1441 09/07/21 0349 09/08/21 0552 09/09/21 0341 09/10/21 0449  WBC 17.0*  --  12.9* 9.4 8.2 6.2 6.2  NEUTROABS 15.0*  --   --   --   --   --   --   HGB 14.4   < > 12.5* 10.2* 10.3* 10.1* 10.8*  HCT 44.3   < > 38.5* 32.1* 31.1* 32.1* 33.0*  MCV 92.1  --  93.2 93.3 90.7 93.0 92.2  PLT 160  --  152 138* 137* 148* 186   < > = values in this interval not displayed.   Cardiac Enzymes: Recent Labs  Lab 09/06/21 1441 09/07/21 0349 09/08/21 0552 09/09/21 0341 09/10/21 0449  CKTOTAL 5,530* 2,625* 839* 555* 265   BNP: Invalid input(s): POCBNP CBG: Recent Labs  Lab 09/11/21 9242 09/11/21 1120 09/11/21  [place] 09/11/21 2137 09/12/21 0640  GLUCAP 159* 193* 174* 259* 212*   D-Dimer No results for input(s): DDIMER in the last 72 hours. Hgb A1c No results for input(s): HGBA1C in the last 72 hours. Lipid Profile No results for input(s): CHOL, HDL, LDLCALC, TRIG, CHOLHDL, LDLDIRECT in the last 72 hours. Thyroid function studies No results for input(s): TSH, T4TOTAL, T3FREE, THYROIDAB in the last 72 hours.  Invalid input(s): FREET3 Anemia work up No results for input(s): VITAMINB12, FOLATE, FERRITIN, TIBC, IRON, RETICCTPCT in the last 72 hours. Urinalysis    Component Value Date/Time   COLORURINE YELLOW 09/10/2021 2205   APPEARANCEUR CLOUDY (A) 09/10/2021 2205   LABSPEC 1.015 09/10/2021 2205   PHURINE 6.0 09/10/2021 2205   GLUCOSEU >=500 (A) 09/10/2021 2205   HGBUR LARGE (A) 09/10/2021 2205   BILIRUBINUR NEGATIVE 09/10/2021 2205   KETONESUR NEGATIVE 09/10/2021 2205   PROTEINUR NEGATIVE 09/10/2021 2205   NITRITE NEGATIVE 09/10/2021 2205   LEUKOCYTESUR MODERATE (A) 09/10/2021 2205   Sepsis Labs Invalid input(s): PROCALCITONIN,  WBC,  LACTICIDVEN Microbiology Recent Results (from  the past 240 hour(s))  Blood culture (routine x 2)     Status: None   Collection Time: 09/05/21 11:05 PM   Specimen: BLOOD LEFT FOREARM  Result Value Ref Range Status   Specimen Description BLOOD LEFT FOREARM  Final   Special Requests   Final    BOTTLES DRAWN AEROBIC AND ANAEROBIC Blood Culture adequate volume   Culture   Final    NO GROWTH 5 DAYS Performed at [facility], [place] [address] [place], [place] [address]    Report Status 09/10/2021 FINAL  Final  Resp Panel by RT-PCR (Flu A&B, Covid)     Status: None   Collection Time: 09/05/21 11:13 PM   Specimen: Nasopharyngeal(NP) swabs in vial transport medium  Result Value Ref Range Status   SARS Coronavirus 2 by RT PCR NEGATIVE NEGATIVE Final    Comment: (NOTE) SARS-CoV-2 target nucleic acids are NOT DETECTED.  The SARS-CoV-2 RNA is generally detectable in upper respiratory specimens during the acute phase of infection. The lowest concentration of SARS-CoV-2 viral copies this assay can detect is 138 copies/mL. A negative result does not preclude SARS-Cov-2 infection and should not be used as the sole basis for treatment or other patient management decisions. A negative result may occur with  improper specimen collection/handling, submission of specimen other than nasopharyngeal swab, presence of viral mutation(s) within the areas targeted by this assay, and inadequate number of viral copies(<138 copies/mL). A negative result must be combined with clinical observations, patient history, and epidemiological information. The expected result is Negative.  Fact Sheet for Patients:  EntrepreneurPulse.com.au  Fact Sheet for Healthcare Providers:  IncredibleEmployment.be  This test is no t yet approved or cleared by the [place] FDA and  has been authorized for detection and/or diagnosis of SARS-CoV-2 by FDA under an Emergency Use Authorization (EUA). This EUA will remain  in effect  (meaning this test can be used) for the duration of the COVID-19 declaration under Section 564(b)(1) of the Act, 21 U.S.C.section 360bbb-3(b)(1), unless the authorization is terminated  or revoked sooner.       Influenza A by PCR NEGATIVE NEGATIVE Final   Influenza B by PCR NEGATIVE NEGATIVE Final    Comment: (NOTE) The Xpert Xpress SARS-CoV-2/FLU/RSV plus assay is intended as an aid in the diagnosis of influenza from Nasopharyngeal swab specimens and should not be used as a sole basis for treatment. Nasal washings and  aspirates are unacceptable for Xpert Xpress SARS-CoV-2/FLU/RSV testing.  Fact Sheet for Patients: EntrepreneurPulse.com.au  Fact Sheet for Healthcare Providers: IncredibleEmployment.be  This test is not yet approved or cleared by the [place] FDA and has been authorized for detection and/or diagnosis of SARS-CoV-2 by FDA under an Emergency Use Authorization (EUA). This EUA will remain in effect (meaning this test can be used) for the duration of the COVID-19 declaration under Section 564(b)(1) of the Act, 21 U.S.C. section 360bbb-3(b)(1), unless the authorization is terminated or revoked.  Performed at [facility], [place] [address] [place], [place] [address]   Blood culture (routine x 2)     Status: None   Collection Time: 09/05/21 11:14 PM   Specimen: BLOOD RIGHT HAND  Result Value Ref Range Status   Specimen Description BLOOD RIGHT HAND  Final   Special Requests   Final    BOTTLES DRAWN AEROBIC AND ANAEROBIC Blood Culture results may not be optimal due to an inadequate volume of blood received in culture bottles   Culture   Final    NO GROWTH 5 DAYS Performed at [facility], [place] [address] [place], [place] [address]    Report Status 09/10/2021 FINAL  Final  Urine Culture     Status: Abnormal   Collection Time: 09/06/21  4:28 AM   Specimen: Urine, Clean Catch  Result Value Ref Range Status    Specimen Description URINE, CLEAN CATCH  Final   Special Requests   Final    NONE Performed at [facility], [place] [address] [place],  [address]    Culture (A)  Final    >=100,000 COLONIES/mL STAPHYLOCOCCUS EPIDERMIDIS >=100,000 COLONIES/mL PSEUDOMONAS AERUGINOSA    Report Status 09/09/2021 FINAL  Final   Organism ID, Bacteria STAPHYLOCOCCUS EPIDERMIDIS (A)  Final   Organism ID, Bacteria PSEUDOMONAS AERUGINOSA (A)  Final      Susceptibility   Pseudomonas aeruginosa - MIC*    CEFTAZIDIME <=1 SENSITIVE Sensitive     CIPROFLOXACIN 2 RESISTANT Resistant     GENTAMICIN <=1 SENSITIVE Sensitive     IMIPENEM >=16 RESISTANT Resistant     PIP/TAZO <=4 SENSITIVE Sensitive     CEFEPIME 0.5 SENSITIVE Sensitive     * >=100,000 COLONIES/mL PSEUDOMONAS AERUGINOSA   Staphylococcus epidermidis - MIC*    CIPROFLOXACIN <=0.5 SENSITIVE Sensitive     GENTAMICIN <=0.5 SENSITIVE Sensitive     NITROFURANTOIN <=16 SENSITIVE Sensitive     OXACILLIN <=0.25 SENSITIVE Sensitive     TETRACYCLINE 2 SENSITIVE Sensitive     VANCOMYCIN 2 SENSITIVE Sensitive     TRIMETH/SULFA 80 RESISTANT Resistant     CLINDAMYCIN <=0.25 SENSITIVE Sensitive     RIFAMPIN <=0.5 SENSITIVE Sensitive     Inducible Clindamycin NEGATIVE Sensitive     * >=100,000 COLONIES/mL STAPHYLOCOCCUS EPIDERMIDIS     Time coordinating discharge: 35 minutes  SIGNED: Antonieta Pert, MD  [facility] 09/12/2021, 10:10 AM  If 7PM-7AM, please contact night-coverage [email or web address]

## 2021-09-12 NOTE — TOC Transition Note (Signed)
Transition of Care [facility]) - CM/SW Discharge Note   Patient Details  Name: Fernando Blankenship MRN: [id number] Date of Birth: 12/30/1945  [facility] CM/SW Contact:  Bartholomew Crews, RN Phone Number: [phone] 09/12/2021, 10:49 AM   Clinical Narrative:     Spoke with patient on hospital room phone to discuss post acute transition home. Demographics verified. Patient agreeable to [facility] PT. Offered choice of [place] agency - referral pending with [facility]. Patient agreeable to walker stating that he only has a cane. Referral to [facility] for delivery to the room. Patient stated that he has transportation home.  Final next level of care: [place] Barriers to Discharge: No Barriers Identified   Patient Goals and CMS Choice Patient states their goals for this hospitalization and ongoing recovery are:: go home CMS Medicare.gov Compare Post Acute Care list provided to:: Patient Choice offered to / list presented to : Patient  Discharge Placement                       Discharge Plan and Services                DME Arranged: Walker rolling DME Agency: [facility] Date DME Agency Contacted: 09/12/21 Time DME Agency Contacted: 60 Representative spoke with at DME Agency: [place]: PT [place]: [place] Date [place]: 09/12/21 Time [place]: 1779 Representative spoke with at [place] (referral pending)  Social Determinants of Health (SDOH) Interventions     Readmission Risk Interventions No flowsheet data found.

## 2021-10-01 DIAGNOSIS — G2581 Restless legs syndrome: Secondary | ICD-10-CM | POA: Diagnosis not present

## 2021-10-01 DIAGNOSIS — E1142 Type 2 diabetes mellitus with diabetic polyneuropathy: Secondary | ICD-10-CM | POA: Diagnosis not present

## 2021-10-01 DIAGNOSIS — M19042 Primary osteoarthritis, left hand: Secondary | ICD-10-CM | POA: Diagnosis not present

## 2021-10-01 DIAGNOSIS — I119 Hypertensive heart disease without heart failure: Secondary | ICD-10-CM | POA: Diagnosis not present

## 2021-10-01 DIAGNOSIS — I248 Other forms of acute ischemic heart disease: Secondary | ICD-10-CM | POA: Diagnosis not present

## 2021-10-01 DIAGNOSIS — N39498 Other specified urinary incontinence: Secondary | ICD-10-CM | POA: Diagnosis not present

## 2021-10-01 DIAGNOSIS — R338 Other retention of urine: Secondary | ICD-10-CM | POA: Diagnosis not present

## 2021-10-01 DIAGNOSIS — M47814 Spondylosis without myelopathy or radiculopathy, thoracic region: Secondary | ICD-10-CM | POA: Diagnosis not present

## 2021-10-01 DIAGNOSIS — C61 Malignant neoplasm of prostate: Secondary | ICD-10-CM | POA: Diagnosis not present

## 2021-10-01 DIAGNOSIS — R310 Gross hematuria: Secondary | ICD-10-CM | POA: Diagnosis not present

## 2021-10-01 DIAGNOSIS — E785 Hyperlipidemia, unspecified: Secondary | ICD-10-CM | POA: Diagnosis not present

## 2021-10-01 DIAGNOSIS — N401 Enlarged prostate with lower urinary tract symptoms: Secondary | ICD-10-CM | POA: Diagnosis not present

## 2021-10-01 DIAGNOSIS — M160 Bilateral primary osteoarthritis of hip: Secondary | ICD-10-CM | POA: Diagnosis not present

## 2021-10-01 DIAGNOSIS — M19041 Primary osteoarthritis, right hand: Secondary | ICD-10-CM | POA: Diagnosis not present

## 2021-10-01 DIAGNOSIS — I2510 Atherosclerotic heart disease of native coronary artery without angina pectoris: Secondary | ICD-10-CM | POA: Diagnosis not present

## 2021-10-01 DIAGNOSIS — M47816 Spondylosis without myelopathy or radiculopathy, lumbar region: Secondary | ICD-10-CM | POA: Diagnosis not present

## 2021-10-01 DIAGNOSIS — K219 Gastro-esophageal reflux disease without esophagitis: Secondary | ICD-10-CM | POA: Diagnosis not present

## 2021-10-23 DIAGNOSIS — R3914 Feeling of incomplete bladder emptying: Secondary | ICD-10-CM | POA: Diagnosis not present

## 2021-10-23 DIAGNOSIS — R338 Other retention of urine: Secondary | ICD-10-CM | POA: Diagnosis not present

## 2021-10-23 DIAGNOSIS — N401 Enlarged prostate with lower urinary tract symptoms: Secondary | ICD-10-CM | POA: Diagnosis not present

## 2021-10-23 DIAGNOSIS — N402 Nodular prostate without lower urinary tract symptoms: Secondary | ICD-10-CM | POA: Diagnosis not present

## 2021-11-11 DIAGNOSIS — N3281 Overactive bladder: Secondary | ICD-10-CM | POA: Diagnosis not present

## 2021-11-11 DIAGNOSIS — R8271 Bacteriuria: Secondary | ICD-10-CM | POA: Diagnosis not present

## 2021-11-27 DIAGNOSIS — N401 Enlarged prostate with lower urinary tract symptoms: Secondary | ICD-10-CM | POA: Diagnosis not present

## 2021-11-27 DIAGNOSIS — R3914 Feeling of incomplete bladder emptying: Secondary | ICD-10-CM | POA: Diagnosis not present

## 2021-11-28 ENCOUNTER — Other Ambulatory Visit: Payer: Self-pay | Admitting: Urology

## 2021-11-28 MED ORDER — SOLIFENACIN SUCCINATE 10 MG PO TABS: 10.0000 mg | ORAL_TABLET | Freq: Every day | ORAL | 2 refills | Status: DC

## 2021-11-28 NOTE — Progress Notes (Signed)
Spoke with patient over the phone. Having pain at tip of penis intermittently with catheter in place. Likely bladder spasms. ? ?Recommended restarting Vesicare. Faxed in refill. Discussed that this was fine while his catheter was in place, but if he is coming up to an appointment where he catheter is going to be removed then he should stop the Vesicare for about 48 hours. ? ?He understood. All additional questions were answered. ? ?Reola Mosher, MD ?[facility] Resident, PGY4 ?[facility] ? ?

## 2021-11-30 DIAGNOSIS — R3914 Feeling of incomplete bladder emptying: Secondary | ICD-10-CM | POA: Diagnosis not present

## 2021-12-02 ENCOUNTER — Other Ambulatory Visit: Payer: Self-pay | Admitting: Urology

## 2021-12-08 ENCOUNTER — Other Ambulatory Visit: Payer: Self-pay

## 2021-12-08 ENCOUNTER — Encounter (HOSPITAL_BASED_OUTPATIENT_CLINIC_OR_DEPARTMENT_OTHER): Payer: Self-pay | Admitting: Urology

## 2021-12-08 NOTE — Progress Notes (Signed)
Reviewed patient chart with dr Wanda Plump hatchett mda, pt needs cardiac clearance prior to 12-11-2021 surgery, called and made selita at dr dahlsted office aware, pt needs cardiac clearance prior to 12-11-2021 per dr f hatchett mda. ?

## 2021-12-09 ENCOUNTER — Telehealth: Payer: Self-pay

## 2021-12-09 NOTE — Telephone Encounter (Signed)
Called patient and made him an appointment with Dr. Acie Fredrickson. Patient agreed to plan and thank [place] for the call.  ?

## 2021-12-09 NOTE — Telephone Encounter (Signed)
-----   Message from Thayer Headings, MD sent at 12/09/2021  8:46 AM EDT ----- ?Thanks Annie Main, ? ?I've reviewed his chart.  He is a previous patient of Dr. Wynonia Lawman and last saw Dr. Geraldo Pitter in 2020.  He has a remote PCI in 2014 Tamala Julian)  ? ?Triage, will you see if if he can be worked in to see me or Dr. Geraldo Pitter soon.   I can work him in tomorrow at 10 am if that works for the patient.   ? ?Thanks   ? ?PN ? ? ? ?----- Message ----- ?From: Franchot Gallo, MD ?Sent: 12/09/2021   7:45 AM EDT ?To: Thayer Headings, MD ? ?Phil-- ? ?This guy was scheduled for a Urolift (minimally invasive procedure for BPH) on Friday. I can do it in the office under nitrous but he is a bit difficult and I think that he is best served by an anesthetic procedure. Only takes ~ 15 minutes so not that involved. He has been catheter dependent over the last few months and has had complications (infections/sepsis) from his catheter.  ?Anesthesia cancelled case d/t need for cardiol clearance. I put request in for that and put your name on request. If you are not available, can you help out w/ getting him seen sooner than later? ?Thanks ? ? ?

## 2021-12-10 ENCOUNTER — Other Ambulatory Visit: Payer: Self-pay

## 2021-12-10 ENCOUNTER — Ambulatory Visit: Payer: BC Managed Care – PPO | Admitting: Cardiovascular Disease

## 2021-12-10 ENCOUNTER — Encounter (HOSPITAL_BASED_OUTPATIENT_CLINIC_OR_DEPARTMENT_OTHER): Payer: Self-pay | Admitting: Urology

## 2021-12-10 ENCOUNTER — Encounter: Payer: Self-pay | Admitting: Cardiovascular Disease

## 2021-12-10 VITALS — BP 122/70 | HR 92 | Ht 67.0 in | Wt 233.4 lb

## 2021-12-10 DIAGNOSIS — I2510 Atherosclerotic heart disease of native coronary artery without angina pectoris: Secondary | ICD-10-CM | POA: Diagnosis not present

## 2021-12-10 DIAGNOSIS — I10 Essential (primary) hypertension: Secondary | ICD-10-CM | POA: Diagnosis not present

## 2021-12-10 NOTE — Progress Notes (Signed)
Spoke w/ via phone for pre-op interview---pt ?Lab needs dos----  istat             ?Lab results------see below ?COVID test -----patient states asymptomatic no test needed ?Arrive at -------1000 am 12-11-2021 ?NPO after MN NO Solid Food.  Clear liquids from MN until---900 am ?Med rec completed ?Medications to take morning of surgery ----- atorvastatin, metoprolol succinate, flomax, prilosec ?Diabetic medication -----none day of surgery ?Patient instructed no nail polish to be worn day of surgery ?Patient instructed to bring photo id and insurance card day of surgery ?Patient aware to have Driver (ride ) / caregiver   friend eddie berry cell [phone] or pt will arrange other adult caregiver/driver for 24 hours after surgery  ?Patient Special Instructions -----none ?Pre-Op special Istructions -----none ?Patient verbalized understanding of instructions that were given at this phone interview. ?Patient denies shortness of breath, chest pain, fever, cough at this phone interview.  ? ?Aspirin 81 mg last dose 12-07-2021 ?Eliquis last dose 12-07-2021  ?Cardiac clearance dr Cathie Olden 12-10-2021 chart/epic ?Ekg 09-05-2021 chart/epic ?Echo 09-06-2021 epic ?Ct chest abd pelvis 09-06-2022 epic ?Nm pulm perfusion 09-06-2021 epic ?Ct head 09-05-2021 epic ?Chest portable 1 view 09-05-2021 epic ? ?
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
## 2021-12-10 NOTE — Patient Instructions (Addendum)
Medication Instructions:  ?Your physician has recommended you make the following change in your medication: ? ?1) STOP Eliquis ? ?**Restart Aspirin '81mg'$  daily when Dr. Diona Fanti says OK. ? ?*If you need a refill on your cardiac medications before your next appointment, please call your pharmacy* ? ?Lab Work: ?In 1 year at follow-up office visit: Lipids, ALT, BMP ?If you have labs (blood work) drawn today and your tests are completely normal, you will receive your results only by: ?MyChart Message (if you have MyChart) OR ?A paper copy in the mail ?If you have any lab test that is abnormal or we need to change your treatment, we will call you to review the results. ? ?Testing/Procedures: ?NONE ? ?Follow-Up: ?At [facility], you and your health needs are our priority.  As part of our continuing mission to provide you with exceptional heart care, we have created designated Provider Care Teams.  These Care Teams include your primary Cardiologist (physician) and Advanced Practice Providers (APPs -  Physician Assistants and Nurse Practitioners) who all work together to provide you with the care you need, when you need it. ? ?Your next appointment:   ?1 year(s) ? ?The format for your next appointment:   ?In Person ? ?Provider:   ?Mertie Moores, MD  or Robbie Lis, PA-C or Richardson Dopp, [place] ?

## 2021-12-11 ENCOUNTER — Ambulatory Visit (HOSPITAL_BASED_OUTPATIENT_CLINIC_OR_DEPARTMENT_OTHER)
Admission: RE | Admit: 2021-12-11 | Discharge: 2021-12-11 | Disposition: A | Discharge status: Home / Self Care | Payer: BC Managed Care – PPO | Source: Ambulatory Visit | Attending: Urology | Admitting: Urology

## 2021-12-11 ENCOUNTER — Encounter (HOSPITAL_BASED_OUTPATIENT_CLINIC_OR_DEPARTMENT_OTHER)
Admission: RE | Disposition: A | Discharge status: Home / Self Care | Payer: Self-pay | Source: Ambulatory Visit | Attending: Urology

## 2021-12-11 ENCOUNTER — Encounter (HOSPITAL_BASED_OUTPATIENT_CLINIC_OR_DEPARTMENT_OTHER): Payer: Self-pay | Admitting: Urology

## 2021-12-11 ENCOUNTER — Ambulatory Visit (HOSPITAL_BASED_OUTPATIENT_CLINIC_OR_DEPARTMENT_OTHER): Payer: BC Managed Care – PPO | Admitting: Anesthesiology

## 2021-12-11 DIAGNOSIS — I129 Hypertensive chronic kidney disease with stage 1 through stage 4 chronic kidney disease, or unspecified chronic kidney disease: Secondary | ICD-10-CM | POA: Insufficient documentation

## 2021-12-11 DIAGNOSIS — N401 Enlarged prostate with lower urinary tract symptoms: Secondary | ICD-10-CM | POA: Diagnosis not present

## 2021-12-11 DIAGNOSIS — Z923 Personal history of irradiation: Secondary | ICD-10-CM | POA: Insufficient documentation

## 2021-12-11 DIAGNOSIS — E669 Obesity, unspecified: Secondary | ICD-10-CM | POA: Insufficient documentation

## 2021-12-11 DIAGNOSIS — Z6837 Body mass index (BMI) 37.0-37.9, adult: Secondary | ICD-10-CM | POA: Diagnosis not present

## 2021-12-11 DIAGNOSIS — R338 Other retention of urine: Secondary | ICD-10-CM | POA: Insufficient documentation

## 2021-12-11 DIAGNOSIS — N189 Chronic kidney disease, unspecified: Secondary | ICD-10-CM | POA: Insufficient documentation

## 2021-12-11 DIAGNOSIS — E1142 Type 2 diabetes mellitus with diabetic polyneuropathy: Secondary | ICD-10-CM | POA: Diagnosis not present

## 2021-12-11 DIAGNOSIS — K219 Gastro-esophageal reflux disease without esophagitis: Secondary | ICD-10-CM | POA: Insufficient documentation

## 2021-12-11 DIAGNOSIS — Z8546 Personal history of malignant neoplasm of prostate: Secondary | ICD-10-CM | POA: Insufficient documentation

## 2021-12-11 DIAGNOSIS — Z7984 Long term (current) use of oral hypoglycemic drugs: Secondary | ICD-10-CM | POA: Diagnosis not present

## 2021-12-11 DIAGNOSIS — Z87891 Personal history of nicotine dependence: Secondary | ICD-10-CM | POA: Insufficient documentation

## 2021-12-11 DIAGNOSIS — N138 Other obstructive and reflux uropathy: Secondary | ICD-10-CM | POA: Diagnosis not present

## 2021-12-11 DIAGNOSIS — N400 Enlarged prostate without lower urinary tract symptoms: Secondary | ICD-10-CM | POA: Diagnosis not present

## 2021-12-11 DIAGNOSIS — Z7901 Long term (current) use of anticoagulants: Secondary | ICD-10-CM | POA: Diagnosis not present

## 2021-12-11 DIAGNOSIS — E1122 Type 2 diabetes mellitus with diabetic chronic kidney disease: Secondary | ICD-10-CM | POA: Insufficient documentation

## 2021-12-11 DIAGNOSIS — Z86718 Personal history of other venous thrombosis and embolism: Secondary | ICD-10-CM | POA: Insufficient documentation

## 2021-12-11 DIAGNOSIS — I2510 Atherosclerotic heart disease of native coronary artery without angina pectoris: Secondary | ICD-10-CM | POA: Insufficient documentation

## 2021-12-11 HISTORY — DX: Urinary tract infection, site not specified: N39.0

## 2021-12-11 HISTORY — PX: CYSTOSCOPY WITH INSERTION OF UROLIFT: SHX6678

## 2021-12-11 LAB — POCT I-STAT, CHEM 8
BUN: 28 mg/dL — ABNORMAL HIGH (ref 8–23)
Calcium, Ion: 1.26 mmol/L (ref 1.15–1.40)
Chloride: 99 mmol/L (ref 98–111)
Creatinine, Ser: 1.2 mg/dL (ref 0.61–1.24)
Glucose, Bld: 196 mg/dL — ABNORMAL HIGH (ref 70–99)
HCT: 38 % — ABNORMAL LOW (ref 39.0–52.0)
Hemoglobin: 12.9 g/dL — ABNORMAL LOW (ref 13.0–17.0)
Potassium: 3.9 mmol/L (ref 3.5–5.1)
Sodium: 138 mmol/L (ref 135–145)
TCO2: 28 mmol/L (ref 22–32)

## 2021-12-11 LAB — GLUCOSE, CAPILLARY: Glucose-Capillary: 174 mg/dL — ABNORMAL HIGH (ref 70–99)

## 2021-12-11 SURGERY — CYSTOSCOPY WITH INSERTION OF UROLIFT
Anesthesia: General

## 2021-12-11 MED ORDER — ONDANSETRON HCL 4 MG/2ML IJ SOLN: 4.0000 mg | Freq: Once | INTRAMUSCULAR | Status: DC | PRN

## 2021-12-11 MED ORDER — ONDANSETRON HCL 4 MG/2ML IJ SOLN
INTRAMUSCULAR | Status: DC | PRN
  Administered 2021-12-11: 4 mg via INTRAVENOUS

## 2021-12-11 MED ORDER — ONDANSETRON HCL 4 MG/2ML IJ SOLN
INTRAMUSCULAR | Status: AC
  Filled 2021-12-11: qty 2

## 2021-12-11 MED ORDER — OXYCODONE HCL 5 MG/5ML PO SOLN: 5.0000 mg | Freq: Once | ORAL | Status: DC | PRN

## 2021-12-11 MED ORDER — SULFAMETHOXAZOLE-TRIMETHOPRIM 800-160 MG PO TABS: 1.0000 | ORAL_TABLET | Freq: Two times a day (BID) | ORAL | 0 refills | Status: AC

## 2021-12-11 MED ORDER — PHENYLEPHRINE HCL (PRESSORS) 10 MG/ML IV SOLN
INTRAVENOUS | Status: DC | PRN
  Administered 2021-12-11: 120 ug via INTRAVENOUS

## 2021-12-11 MED ORDER — ACETAMINOPHEN 500 MG PO TABS
1000.0000 mg | ORAL_TABLET | Freq: Once | ORAL | Status: AC
  Administered 2021-12-11: 1000 mg via ORAL

## 2021-12-11 MED ORDER — CEFAZOLIN SODIUM-DEXTROSE 2-3 GM-%(50ML) IV SOLR
INTRAVENOUS | Status: DC | PRN
  Administered 2021-12-11: 2 g via INTRAVENOUS

## 2021-12-11 MED ORDER — KETOROLAC TROMETHAMINE 30 MG/ML IJ SOLN: 15.0000 mg | Freq: Once | INTRAMUSCULAR | Status: DC | PRN

## 2021-12-11 MED ORDER — FENTANYL CITRATE (PF) 100 MCG/2ML IJ SOLN
INTRAMUSCULAR | Status: DC | PRN
  Administered 2021-12-11: 50 ug via INTRAVENOUS

## 2021-12-11 MED ORDER — FENTANYL CITRATE (PF) 100 MCG/2ML IJ SOLN
INTRAMUSCULAR | Status: AC
  Filled 2021-12-11: qty 2

## 2021-12-11 MED ORDER — ACETAMINOPHEN 500 MG PO TABS
ORAL_TABLET | ORAL | Status: AC
  Filled 2021-12-11: qty 2

## 2021-12-11 MED ORDER — PROPOFOL 10 MG/ML IV BOLUS
INTRAVENOUS | Status: AC
  Filled 2021-12-11: qty 20

## 2021-12-11 MED ORDER — LIDOCAINE 2% (20 MG/ML) 5 ML SYRINGE
INTRAMUSCULAR | Status: DC | PRN
  Administered 2021-12-11: 60 mg via INTRAVENOUS

## 2021-12-11 MED ORDER — OXYCODONE HCL 5 MG PO TABS: 5.0000 mg | ORAL_TABLET | Freq: Once | ORAL | Status: DC | PRN

## 2021-12-11 MED ORDER — LIDOCAINE HCL (PF) 2 % IJ SOLN
INTRAMUSCULAR | Status: AC
  Filled 2021-12-11: qty 5

## 2021-12-11 MED ORDER — STERILE WATER FOR IRRIGATION IR SOLN
Status: DC | PRN
  Administered 2021-12-11: 3000 mL

## 2021-12-11 MED ORDER — LACTATED RINGERS IV SOLN: INTRAVENOUS | Status: DC

## 2021-12-11 MED ORDER — AMISULPRIDE (ANTIEMETIC) 5 MG/2ML IV SOLN: 10.0000 mg | Freq: Once | INTRAVENOUS | Status: DC | PRN

## 2021-12-11 MED ORDER — PROPOFOL 10 MG/ML IV BOLUS
INTRAVENOUS | Status: DC | PRN
Start: 2021-12-11 — End: 2021-12-11
  Administered 2021-12-11: 100 mg via INTRAVENOUS
  Administered 2021-12-11: 40 mg via INTRAVENOUS
  Administered 2021-12-11: 20 mg via INTRAVENOUS

## 2021-12-11 MED ORDER — DEXAMETHASONE SODIUM PHOSPHATE 10 MG/ML IJ SOLN
INTRAMUSCULAR | Status: AC
  Filled 2021-12-11: qty 1

## 2021-12-11 MED ORDER — HYDROMORPHONE HCL 1 MG/ML IJ SOLN: 0.2500 mg | INTRAMUSCULAR | Status: DC | PRN

## 2021-12-11 SURGICAL SUPPLY — 18 items
BAG DRAIN URO-CYSTO SKYTR STRL (DRAIN) ×2 IMPLANT
BAG DRN RND TRDRP ANRFLXCHMBR (UROLOGICAL SUPPLIES) ×1
BAG DRN UROCATH (DRAIN) ×1
BAG URINE DRAIN 2000ML AR STRL (UROLOGICAL SUPPLIES) ×2 IMPLANT
BAG URINE LEG 500ML (DRAIN) ×4 IMPLANT
CATH FOLEY 2WAY SLVR  5CC 18FR (CATHETERS)
CATH FOLEY 2WAY SLVR 5CC 18FR (CATHETERS) IMPLANT
CLOTH BEACON ORANGE TIMEOUT ST (SAFETY) ×2 IMPLANT
GLOVE SURG ENC MOIS LTX SZ8 (GLOVE) ×2 IMPLANT
GLOVE SURG UNDER POLY LF SZ7 (GLOVE) ×2 IMPLANT
GOWN STRL REUS W/TWL LRG LVL3 (GOWN DISPOSABLE) ×1 IMPLANT
GOWN STRL REUS W/TWL XL LVL3 (GOWN DISPOSABLE) ×2 IMPLANT
KIT TURNOVER CYSTO (KITS) ×2 IMPLANT
MANIFOLD NEPTUNE II (INSTRUMENTS) ×2 IMPLANT
PACK CYSTO (CUSTOM PROCEDURE TRAY) ×2 IMPLANT
SYSTEM UROLIFT (Male Continence) ×4 IMPLANT
TUBE CONNECTING 12X1/4 (SUCTIONS) ×2 IMPLANT
WATER STERILE IRR 3000ML UROMA (IV SOLUTION) ×3 IMPLANT

## 2021-12-11 NOTE — Anesthesia Preprocedure Evaluation (Addendum)
Anesthesia Evaluation  ?Patient identified by MRN, date of birth, ID band ?Patient awake ? ? ? ?Reviewed: ?Allergy & Precautions, NPO status , Patient's Chart, lab work & pertinent test results, reviewed documented beta blocker date and time  ? ?Airway ?Mallampati: II ? ?TM Distance: >3 FB ?Neck ROM: Full ? ? ? Dental ? ?(+) Teeth Intact, Dental Advisory Given ?  ?Pulmonary ?former smoker,  ?Quit smoking 1987, 26 pack year history  ?  ?Pulmonary exam normal ?breath sounds clear to auscultation ? ? ? ? ? ? Cardiovascular ?hypertension, Pt. on medications and Pt. on home beta blockers ?+ CAD (PCI 2014) and + DVT (08/2021, on eliquis last dose 4d ago)  ?Normal cardiovascular exam ?Rhythm:Regular Rate:Normal ? ?Echo 08/2021: ??1. Left ventricular ejection fraction, by estimation, is 60 to 65%. The  ?left ventricle has normal function. Left ventricular endocardial border  ?not optimally defined to evaluate regional wall motion. Indeterminate  ?diastolic filling due to E-A fusion.  ??2. Right ventricular systolic function is normal. The right ventricular  ?size is normal. There is normal pulmonary artery systolic pressure. The  ?estimated right ventricular systolic pressure is 8.4 mmHg.  ??3. The mitral valve is grossly normal. No evidence of mitral valve  ?regurgitation. No evidence of mitral stenosis.  ??4. The aortic valve is tricuspid. There is mild calcification of the  ?aortic valve. Aortic valve regurgitation is not visualized. Aortic valve  ?sclerosis is present, with no evidence of aortic valve stenosis.  ??5. The inferior vena cava is normal in size with greater than 50%  ?respiratory variability, suggesting right atrial pressure of 3 mmHg.  ?  ?Neuro/Psych ? Neuromuscular disease (RLS, peripheral neuropathy 2/2 T2DM) negative psych ROS  ? GI/Hepatic ?Neg liver ROS, hiatal hernia, GERD  Controlled and Medicated,  ?Endo/Other  ?diabetes, Well Controlled, Type 2, Oral Hypoglycemic  AgentsObesity BMI 37 ?a1c 6.6 ? Renal/GU ?Renal InsufficiencyRenal diseaseCr 1.2 Bladder dysfunction (BPH) ? ? ? ?  ?Musculoskeletal ? ?(+) Arthritis , Osteoarthritis,   ? Abdominal ?(+) + obese,   ?Peds ? Hematology ?negative hematology ROS ?(+)   ?Anesthesia Other Findings ? ? Reproductive/Obstetrics ?negative OB ROS ? ?  ? ? ? ? ? ? ? ? ? ? ? ? ? ?  ?  ? ? ? ? ? ? ? ?Anesthesia Physical ?Anesthesia Plan ? ?ASA: 2 ? ?Anesthesia Plan: General  ? ?Post-op Pain Management: Tylenol PO (pre-op)* and Toradol IV (intra-op)*  ? ?Induction: Intravenous ? ?PONV Risk Score and Plan: 3 and Ondansetron, Dexamethasone and Treatment may vary due to age or medical condition ? ?Airway Management Planned: LMA ? ?Additional Equipment: None ? ?Intra-op Plan:  ? ?Post-operative Plan: Extubation in OR ? ?Informed Consent: I have reviewed the patients History and Physical, chart, labs and discussed the procedure including the risks, benefits and alternatives for the proposed anesthesia with the patient or authorized representative who has indicated his/her understanding and acceptance.  ? ? ? ?Dental advisory given ? ?Plan Discussed with: CRNA ? ?Anesthesia Plan Comments:   ? ? ? ? ? ?Anesthesia Quick Evaluation ? ?

## 2021-12-11 NOTE — Discharge Instructions (Addendum)
You may see some blood in the urine and may have some burning with urination for 48-72 hours. You also may notice that you have to urinate more frequently or urgently after your procedure which is normal.  ?You should call should you develop an inability urinate, fever > 101, persistent nausea and vomiting that prevents you from eating or drinking to stay hydrated.  ? ?If you have a catheter, you will be taught how to take care of the catheter by the nursing staff prior to discharge from the hospital.  You may periodically feel a strong urge to void with the catheter in place.  This is a bladder spasm and most often can occur when having a bowel movement or moving around. It is typically self-limited and usually will stop after a few minutes.  You may use some Vaseline or Neosporin around the tip of the catheter to reduce friction at the tip of the penis. You may also see some blood in the urine.  A very small amount of blood can make the urine look quite red.  As long as the catheter is draining well, there usually is not a problem.  However, if the catheter is not draining well and is bloody, you should call the office [phone]) to notify us. ? ?No acetaminophen/Tylenol until after 4:00pm today if needed for pain.  ? ? ? ?Post Anesthesia Home Care Instructions ? ?Activity: ?Get plenty of rest for the remainder of the day. A responsible individual must stay with you for 24 hours following the procedure.  ?For the next 24 hours, DO NOT: ?-Drive a car ?-Paediatric nurse ?-Drink alcoholic beverages ?-Take any medication unless instructed by your physician ?-Make any legal decisions or sign important papers. ? ?Meals: ?Start with liquid foods such as gelatin or soup. Progress to regular foods as tolerated. Avoid greasy, spicy, heavy foods. If nausea and/or vomiting occur, drink only clear liquids until the nausea and/or vomiting subsides. Call your physician if vomiting continues. ? ?Special  Instructions/Symptoms: ?Your throat may feel dry or sore from the anesthesia or the breathing tube placed in your throat during surgery. If this causes discomfort, gargle with warm salt water. The discomfort should disappear within 24 hours. ? ? ?    ?
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
## 2021-12-11 NOTE — Anesthesia Postprocedure Evaluation (Signed)
Anesthesia Post Note ? ?Patient: Maleki Hippe Burlison ? ?Procedure(s) Performed: CYSTOSCOPY WITH INSERTION OF UROLIFT ? ?  ? ?Patient location during evaluation: PACU ?Anesthesia Type: General ?Level of consciousness: awake and alert, oriented and patient cooperative ?Pain management: pain level controlled ?Vital Signs Assessment: post-procedure vital signs reviewed and stable ?Respiratory status: spontaneous breathing, nonlabored ventilation and respiratory function stable ?Cardiovascular status: blood pressure returned to baseline and stable ?Postop Assessment: no apparent nausea or vomiting ?Anesthetic complications: no ? ? ?No notable events documented. ? ?Last Vitals:  ?Vitals:  ? 12/11/21 1315 12/11/21 1330  ?BP: 135/78 137/79  ?Pulse: 73 70  ?Resp: 15 12  ?Temp: (!) 36.3 ?C   ?SpO2: 96% 97%  ?  ?Last Pain:  ?Vitals:  ? 12/11/21 1330  ?TempSrc:   ?PainSc: 0-No pain  ? ? ?  ?  ?  ?  ?  ?  ? ?Jarome Matin Skiler Tye ? ? ? ? ?

## 2021-12-11 NOTE — Op Note (Addendum)
Preoperative diagnosis: BPH with obstruction  Postoperative diagnosis: Same  Principal procedure: Placement of 4 UroLift implants, cystoscopy  Surgeon: Markiesha Delia  Anesthesia: General with LMA  Complications: None  Indications: 76 year old male more than 6 months out from radiotherapy for prostate cancer.  Quite a few weeks out from his procedure he developed urinary retention.  He has failed multiple voiding trials.  He has been on multiple medical therapies for his BPH/obstruction.  In spite all of these, he remains catheter dependent.  This has significantly decreased his quality of life and prevented him from working.  He presents at this time for UroLift for management of his obstructive uropathy  Description of procedure: Patient was properly identified in the holding area and received 2 g of IV Ancef.  He is taken to the operating room where general anesthetic was administered with the LMA.  He was placed in the dorsolithotomy position.  Genitalia and perineum were prepped, draped, proper timeout performed.  30 French cystoscope advanced under direct vision.  There was mild to moderate prostatic obstruction.  Bladder revealed findings consistent with long-term catheterization.  No lesions were seen.  4 UroLift implants were placed.  The first 2 were placed approximately 1 cm distal to the bladder neck, 1 on the right prostatic lobe, 1 on the left.  They were angled somewhat superiorly, to the 10 and 2:00 positions, respectively.  The second 2 were placed just proximal to the verumontanum, and the same angle as the first 2.  At this point, there was excellent separation of the prostatic lobes and no evidence of obstruction.  83 French Foley catheter was placed.  This was hooked to dependent drainage after the balloon filled with 10 cc of water.  The patient tolerated procedure well.  He was awakened, taken to the PACU in stable condition, having tolerated the procedure well.

## 2021-12-11 NOTE — Anesthesia Procedure Notes (Signed)
Procedure Name: LMA Insertion ?Date/Time: 12/11/2021 12:16 PM ?Performed by: Georgeanne Nim, CRNA ?Pre-anesthesia Checklist: Patient identified, Emergency Drugs available, Suction available, Patient being monitored and Timeout performed ?Patient Re-evaluated:Patient Re-evaluated prior to induction ?Oxygen Delivery Method: Circle system utilized ?Preoxygenation: Pre-oxygenation with 100% oxygen ?Induction Type: IV induction ?Ventilation: Mask ventilation without difficulty ?LMA: LMA inserted ?LMA Size: 4.0 ?Placement Confirmation: positive ETCO2, CO2 detector and breath sounds checked- equal and bilateral ?Tube secured with: Tape ?Dental Injury: Teeth and Oropharynx as per pre-operative assessment  ? ? ? ? ?

## 2021-12-11 NOTE — Transfer of Care (Signed)
Immediate Anesthesia Transfer of Care Note ? ?Patient: Fernando Blankenship ? ?Procedure(s) Performed: CYSTOSCOPY WITH INSERTION OF UROLIFT ? ?Patient Location: PACU ? ?Anesthesia Type:General ? ?Level of Consciousness: awake, alert , oriented and patient cooperative ? ?Airway & Oxygen Therapy: Patient Spontanous Breathing and Patient connected to nasal cannula oxygen ? ?Post-op Assessment: Report given to RN and Post -op Vital signs reviewed and stable ? ?Post vital signs: Reviewed and stable ? ?Last Vitals:  ?Vitals Value Taken Time  ?BP 131/79 12/11/21 1249  ?Temp    ?Pulse 75 12/11/21 1251  ?Resp 14 12/11/21 1251  ?SpO2 98 % 12/11/21 1251  ?Vitals shown include unvalidated device data. ? ?Last Pain:  ?Vitals:  ? 12/11/21 1019  ?TempSrc: Oral  ?PainSc: 0-No pain  ?   ? ?Patients Stated Pain Goal: 3 (12/11/21 1019) ? ?Complications: No notable events documented. ?

## 2021-12-11 NOTE — Anesthesia Postprocedure Evaluation (Deleted)
Anesthesia Post Note ? ?Patient: Fernando Blankenship ? ?Procedure(s) Performed: CYSTOSCOPY WITH INSERTION OF UROLIFT ? ?  ? ?Patient location during evaluation: PACU ?Anesthesia Type: General ?Level of consciousness: awake and alert, oriented and patient cooperative ?Pain management: pain level controlled ?Vital Signs Assessment: post-procedure vital signs reviewed and stable ?Respiratory status: spontaneous breathing, nonlabored ventilation and respiratory function stable ?Cardiovascular status: blood pressure returned to baseline and stable ?Postop Assessment: no apparent nausea or vomiting ?Anesthetic complications: no ? ? ?No notable events documented. ? ?Last Vitals:  ?Vitals:  ? 12/11/21 1019  ?BP: 129/70  ?Pulse: 89  ?Resp: 16  ?Temp: 36.6 ?C  ?SpO2: 97%  ?  ?Last Pain:  ?Vitals:  ? 12/11/21 1019  ?TempSrc: Oral  ?PainSc: 0-No pain  ? ? ?  ?  ?  ?  ?  ?  ? ?Jarome Matin Moataz Tavis ? ? ? ? ?

## 2021-12-11 NOTE — Interval H&P Note (Signed)
History and Physical Interval Note: ? ?12/11/2021 ?12:05 PM ? ?Fernando Blankenship  has presented today for surgery, with the diagnosis of BPH.  The various methods of treatment have been discussed with the patient and family. After consideration of risks, benefits and other options for treatment, the patient has consented to  Procedure(s) with comments: ?[place] (N/A) - 30MINS as a surgical intervention.  The patient's history has been reviewed, patient examined, no change in status, stable for surgery.  I have reviewed the patient's chart and labs.  Questions were answered to the patient's satisfaction.   ? ? ?Lillette Boxer Lucita Montoya ? ? ?

## 2021-12-14 ENCOUNTER — Encounter (HOSPITAL_BASED_OUTPATIENT_CLINIC_OR_DEPARTMENT_OTHER): Payer: Self-pay | Admitting: Urology

## 2021-12-14 NOTE — Addendum Note (Signed)
Addended by: Janan Halter F on: 12/14/2021 04:28 PM ? ? Modules accepted: Orders ? ?

## 2021-12-15 DIAGNOSIS — R338 Other retention of urine: Secondary | ICD-10-CM | POA: Diagnosis not present

## 2021-12-24 ENCOUNTER — Ambulatory Visit: Payer: BC Managed Care – PPO | Admitting: Cardiology

## 2021-12-24 DIAGNOSIS — I10 Essential (primary) hypertension: Secondary | ICD-10-CM | POA: Diagnosis not present

## 2021-12-24 DIAGNOSIS — E1169 Type 2 diabetes mellitus with other specified complication: Secondary | ICD-10-CM | POA: Diagnosis not present

## 2021-12-24 DIAGNOSIS — E781 Pure hyperglyceridemia: Secondary | ICD-10-CM | POA: Diagnosis not present

## 2021-12-24 DIAGNOSIS — Z0000 Encounter for general adult medical examination without abnormal findings: Secondary | ICD-10-CM | POA: Diagnosis not present

## 2021-12-24 DIAGNOSIS — K219 Gastro-esophageal reflux disease without esophagitis: Secondary | ICD-10-CM | POA: Diagnosis not present

## 2021-12-24 DIAGNOSIS — I2510 Atherosclerotic heart disease of native coronary artery without angina pectoris: Secondary | ICD-10-CM | POA: Diagnosis not present

## 2021-12-31 DIAGNOSIS — R338 Other retention of urine: Secondary | ICD-10-CM | POA: Diagnosis not present

## 2021-12-31 DIAGNOSIS — C61 Malignant neoplasm of prostate: Secondary | ICD-10-CM | POA: Diagnosis not present

## 2022-01-26 DIAGNOSIS — E119 Type 2 diabetes mellitus without complications: Secondary | ICD-10-CM | POA: Diagnosis not present

## 2022-01-26 DIAGNOSIS — H35371 Puckering of macula, right eye: Secondary | ICD-10-CM | POA: Diagnosis not present

## 2022-02-15 DIAGNOSIS — R310 Gross hematuria: Secondary | ICD-10-CM | POA: Diagnosis not present

## 2022-02-15 DIAGNOSIS — R3914 Feeling of incomplete bladder emptying: Secondary | ICD-10-CM | POA: Diagnosis not present

## 2022-02-15 DIAGNOSIS — N401 Enlarged prostate with lower urinary tract symptoms: Secondary | ICD-10-CM | POA: Diagnosis not present

## 2022-03-29 DIAGNOSIS — R3914 Feeling of incomplete bladder emptying: Secondary | ICD-10-CM | POA: Diagnosis not present

## 2022-03-29 DIAGNOSIS — N401 Enlarged prostate with lower urinary tract symptoms: Secondary | ICD-10-CM | POA: Diagnosis not present

## 2022-04-05 DIAGNOSIS — C61 Malignant neoplasm of prostate: Secondary | ICD-10-CM | POA: Diagnosis not present

## 2022-04-05 DIAGNOSIS — N401 Enlarged prostate with lower urinary tract symptoms: Secondary | ICD-10-CM | POA: Diagnosis not present

## 2022-04-05 DIAGNOSIS — R3914 Feeling of incomplete bladder emptying: Secondary | ICD-10-CM | POA: Diagnosis not present

## 2022-05-10 DIAGNOSIS — R338 Other retention of urine: Secondary | ICD-10-CM | POA: Diagnosis not present

## 2022-05-10 DIAGNOSIS — R8271 Bacteriuria: Secondary | ICD-10-CM | POA: Diagnosis not present

## 2022-05-10 DIAGNOSIS — N3000 Acute cystitis without hematuria: Secondary | ICD-10-CM | POA: Diagnosis not present

## 2022-06-21 DIAGNOSIS — I10 Essential (primary) hypertension: Secondary | ICD-10-CM | POA: Diagnosis not present

## 2022-06-21 DIAGNOSIS — E1169 Type 2 diabetes mellitus with other specified complication: Secondary | ICD-10-CM | POA: Diagnosis not present

## 2022-06-21 DIAGNOSIS — R319 Hematuria, unspecified: Secondary | ICD-10-CM | POA: Diagnosis not present

## 2022-06-21 DIAGNOSIS — Z7984 Long term (current) use of oral hypoglycemic drugs: Secondary | ICD-10-CM | POA: Diagnosis not present

## 2022-06-21 DIAGNOSIS — Z23 Encounter for immunization: Secondary | ICD-10-CM | POA: Diagnosis not present

## 2022-06-21 DIAGNOSIS — R809 Proteinuria, unspecified: Secondary | ICD-10-CM | POA: Diagnosis not present

## 2022-06-28 DIAGNOSIS — N3000 Acute cystitis without hematuria: Secondary | ICD-10-CM | POA: Diagnosis not present

## 2022-06-28 DIAGNOSIS — C61 Malignant neoplasm of prostate: Secondary | ICD-10-CM | POA: Diagnosis not present

## 2022-06-28 DIAGNOSIS — R3914 Feeling of incomplete bladder emptying: Secondary | ICD-10-CM | POA: Diagnosis not present

## 2022-06-28 DIAGNOSIS — N401 Enlarged prostate with lower urinary tract symptoms: Secondary | ICD-10-CM | POA: Diagnosis not present

## 2022-07-05 ENCOUNTER — Ambulatory Visit: Payer: BC Managed Care – PPO | Admitting: Podiatry

## 2022-07-15 ENCOUNTER — Ambulatory Visit: Payer: BC Managed Care – PPO | Admitting: Podiatry

## 2022-07-19 ENCOUNTER — Ambulatory Visit (INDEPENDENT_AMBULATORY_CARE_PROVIDER_SITE_OTHER): Payer: BC Managed Care – PPO | Admitting: Podiatry

## 2022-07-19 DIAGNOSIS — B351 Tinea unguium: Secondary | ICD-10-CM

## 2022-07-19 DIAGNOSIS — E1149 Type 2 diabetes mellitus with other diabetic neurological complication: Secondary | ICD-10-CM

## 2022-07-19 DIAGNOSIS — Z79899 Other long term (current) drug therapy: Secondary | ICD-10-CM | POA: Diagnosis not present

## 2022-07-19 DIAGNOSIS — I2510 Atherosclerotic heart disease of native coronary artery without angina pectoris: Secondary | ICD-10-CM

## 2022-07-19 MED ORDER — GABAPENTIN 100 MG PO CAPS: 100.0000 mg | ORAL_CAPSULE | Freq: Every day | ORAL | 0 refills | Status: DC
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
## 2022-07-19 NOTE — Patient Instructions (Signed)
Gabapentin Capsules or Tablets What is this medication? GABAPENTIN (GA ba pen tin) treats nerve pain. It may also be used to prevent and control seizures in people with epilepsy. It works by calming overactive nerves in your body. This medicine may be used for other purposes; ask your health care provider or pharmacist if you have questions. COMMON BRAND NAME(S): Active-PAC with Gabapentin, Gabarone, Gralise, Neurontin What should I tell my care team before I take this medication? They need to know if you have any of these conditions: Alcohol or substance use disorder Kidney disease Lung or breathing disease Suicidal thoughts, plans, or attempt; a previous suicide attempt by you or a family member An unusual or allergic reaction to gabapentin, other medications, foods, dyes, or preservatives Pregnant or trying to get pregnant Breast-feeding How should I use this medication? Take this medication by mouth with a glass of water. Follow the directions on the prescription label. You can take it with or without food. If it upsets your stomach, take it with food. Take your medication at regular intervals. Do not take it more often than directed. Do not stop taking except on your care team's advice. If you are directed to break the 600 or 800 mg tablets in half as part of your dose, the extra half tablet should be used for the next dose. If you have not used the extra half tablet within 28 days, it should be thrown away. A special MedGuide will be given to you by the pharmacist with each prescription and refill. Be sure to read this information carefully each time. Talk to your care team about the use of this medication in children. While this medication may be prescribed for children as young as 3 years for selected conditions, precautions do apply. Overdosage: If you think you have taken too much of this medicine contact a poison control center or emergency room at once. NOTE: This medicine is only for  you. Do not share this medicine with others. What if I miss a dose? If you miss a dose, take it as soon as you can. If it is almost time for your next dose, take only that dose. Do not take double or extra doses. What may interact with this medication? Alcohol Antihistamines for allergy, cough, and cold Certain medications for anxiety or sleep Certain medications for depression like amitriptyline, fluoxetine, sertraline Certain medications for seizures like phenobarbital, primidone Certain medications for stomach problems General anesthetics like halothane, isoflurane, methoxyflurane, propofol Local anesthetics like lidocaine, pramoxine, tetracaine Medications that relax muscles for surgery Opioid medications for pain Phenothiazines like chlorpromazine, mesoridazine, prochlorperazine, thioridazine This list may not describe all possible interactions. Give your health care provider a list of all the medicines, herbs, non-prescription drugs, or dietary supplements you use. Also tell them if you smoke, drink alcohol, or use illegal drugs. Some items may interact with your medicine. What should I watch for while using this medication? Visit your care team for regular checks on your progress. You may want to keep a record at home of how you feel your condition is responding to treatment. You may want to share this information with your care team at each visit. You should contact your care team if your seizures get worse or if you have any new types of seizures. Do not stop taking this medication or any of your seizure medications unless instructed by your care team. Stopping your medication suddenly can increase your seizures or their severity. This medication may cause serious skin   reactions. They can happen weeks to months after starting the medication. Contact your care team right away if you notice fevers or flu-like symptoms with a rash. The rash may be red or purple and then turn into blisters or  peeling of the skin. Or, you might notice a red rash with swelling of the face, lips or lymph nodes in your neck or under your arms. Wear a medical identification bracelet or chain if you are taking this medication for seizures. Carry a card that lists all your medications. This medication may affect your coordination, reaction time, or judgment. Do not drive or operate machinery until you know how this medication affects you. Sit up or stand slowly to reduce the risk of dizzy or fainting spells. Drinking alcohol with this medication can increase the risk of these side effects. Your mouth may get dry. Chewing sugarless gum or sucking hard candy, and drinking plenty of water may help. Watch for new or worsening thoughts of suicide or depression. This includes sudden changes in mood, behaviors, or thoughts. These changes can happen at any time but are more common in the beginning of treatment or after a change in dose. Call your care team right away if you experience these thoughts or worsening depression. If you become pregnant while using this medication, you may enroll in the [place] Pregnancy Registry by calling [phone]. This registry collects information about the safety of antiepileptic medication use during pregnancy. What side effects may I notice from receiving this medication? Side effects that you should report to your care team as soon as possible: Allergic reactions or angioedema--skin rash, itching, hives, swelling of the face, eyes, lips, tongue, arms, or legs, trouble swallowing or breathing Rash, fever, and swollen lymph nodes Thoughts of suicide or self harm, worsening mood, feelings of depression Trouble breathing Unusual changes in mood or behavior in children after use such as difficulty concentrating, hostility, or restlessness Side effects that usually do not require medical attention (report to your care team if they continue or are  bothersome): Dizziness Drowsiness Nausea Swelling of ankles, feet, or hands Vomiting This list may not describe all possible side effects. Call your doctor for medical advice about side effects. You may report side effects to FDA at 1-800-FDA-1088. Where should I keep my medication? Keep out of reach of children and pets. Store at room temperature between 15 and 30 degrees C (59 and 86 degrees F). Get rid of any unused medication after the expiration date. This medication may cause accidental overdose and death if taken by other adults, children, or pets. To get rid of medications that are no longer needed or have expired: Take the medication to a medication take-back program. Check with your pharmacy or law enforcement to find a location. If you cannot return the medication, check the label or package insert to see if the medication should be thrown out in the garbage or flushed down the toilet. If you are not sure, ask your care team. If it is safe to put it in the trash, empty the medication out of the container. Mix the medication with cat litter, dirt, coffee grounds, or other unwanted substance. Seal the mixture in a bag or container. Put it in the trash. NOTE: This sheet is a summary. It may not cover all possible information. If you have questions about this medicine, talk to your doctor, pharmacist, or health care provider.  2023 Elsevier/Gold Standard (2020-09-16 00:00:00)   Terbinafine Tablets What is this  medication? TERBINAFINE (TER bin a feen) treats fungal infections of the nails. It belongs to a group of medications called antifungals. It will not treat infections caused by bacteria or viruses. This medicine may be used for other purposes; ask your health care provider or pharmacist if you have questions. COMMON BRAND NAME(S): Lamisil, Terbinex What should I tell my care team before I take this medication? They need to know if you have any of these conditions: Liver  disease An unusual or allergic reaction to terbinafine, other medications, foods, dyes, or preservatives Pregnant or trying to get pregnant Breast-feeding How should I use this medication? Take this medication by mouth with water. Take it as directed on the prescription label at the same time every day. You can take it with or without food. If it upsets your stomach, take it with food. Keep taking it unless your care team tells you to stop. A special MedGuide will be given to you by the pharmacist with each prescription and refill. Be sure to read this information carefully each time. Talk to your care team regarding the use of this medication in children. Special care may be needed. Overdosage: If you think you have taken too much of this medicine contact a poison control center or emergency room at once. NOTE: This medicine is only for you. Do not share this medicine with others. What if I miss a dose? If you miss a dose, take it as soon as you can unless it is more than 4 hours late. If it is more than 4 hours late, skip the missed dose. Take the next dose at the normal time. What may interact with this medication? Do not take this medication with any of the following: Pimozide Thioridazine This medication may also interact with the following: Beta blockers Caffeine Certain medications for mental health conditions Cimetidine Cyclosporine Medications for fungal infections like fluconazole and ketoconazole Medications for irregular heartbeat like amiodarone, flecainide and propafenone Rifampin Warfarin This list may not describe all possible interactions. Give your health care provider a list of all the medicines, herbs, non-prescription drugs, or dietary supplements you use. Also tell them if you smoke, drink alcohol, or use illegal drugs. Some items may interact with your medicine. What should I watch for while using this medication? Visit your care team for regular checks on your  progress. You may need blood work while you are taking this medication. It may be some time before you see the benefit from this medication. This medication may cause serious skin reactions. They can happen weeks to months after starting the medication. Contact your care team right away if you notice fevers or flu-like symptoms with a rash. The rash may be red or purple and then turn into blisters or peeling of the skin. Or, you might notice a red rash with swelling of the face, lips or lymph nodes in your neck or under your arms. This medication can make you more sensitive to the sun. Keep out of the sun, If you cannot avoid being in the sun, wear protective clothing and sunscreen. Do not use sun lamps or tanning beds/booths. What side effects may I notice from receiving this medication? Side effects that you should report to your care team as soon as possible: Allergic reactions--skin rash, itching, hives, swelling of the face, lips, tongue, or throat Change in sense of smell Change in taste Infection--fever, chills, cough, or sore throat Liver injury--right upper belly pain, loss of appetite, nausea, light-colored stool, dark yellow  or brown urine, yellowing skin or eyes, unusual weakness or fatigue Low red blood cell level--unusual weakness or fatigue, dizziness, headache, trouble breathing Lupus-like syndrome--joint pain, swelling, or stiffness, butterfly-shaped rash on the face, rashes that get worse in the sun, fever, unusual weakness or fatigue Rash, fever, and swollen lymph nodes Redness, blistering, peeling, or loosening of the skin, including inside the mouth Unusual bruising or bleeding Worsening mood, feelings of depression Side effects that usually do not require medical attention (report to your care team if they continue or are bothersome): Diarrhea Gas Headache Nausea Stomach pain Upset stomach This list may not describe all possible side effects. Call your doctor for medical  advice about side effects. You may report side effects to FDA at 1-800-FDA-1088. Where should I keep my medication? Keep out of the reach of children and pets. Store between 20 and 25 degrees C (68 and 77 degrees F). Protect from light. Get rid of any unused medication after the expiration date. To get rid of medications that are no longer needed or have expired: Take the medication to a medication take-back program. Check with your pharmacy or law enforcement to find a location. If you cannot return the medication, check the label or package insert to see if the medication should be thrown out in the garbage or flushed down the toilet. If you are not sure, ask your care team. If it is safe to put it in the trash, take the medication out of the container. Mix the medication with cat litter, dirt, coffee grounds, or other unwanted substance. Seal the mixture in a bag or container. Put it in the trash. NOTE: This sheet is a summary. It may not cover all possible information. If you have questions about this medicine, talk to your doctor, pharmacist, or health care provider.  2023 Elsevier/Gold Standard (2021-04-07 00:00:00)

## 2022-07-20 ENCOUNTER — Other Ambulatory Visit: Payer: Self-pay | Admitting: Podiatry

## 2022-07-20 DIAGNOSIS — Z79899 Other long term (current) drug therapy: Secondary | ICD-10-CM

## 2022-07-20 LAB — CBC WITH DIFFERENTIAL/PLATELET
Absolute Monocytes: 441 cells/uL (ref 200–950)
Basophils Absolute: 42 cells/uL (ref 0–200)
Basophils Relative: 0.6 %
Eosinophils Absolute: 301 cells/uL (ref 15–500)
Eosinophils Relative: 4.3 %
HCT: 39.9 % (ref 38.5–50.0)
Hemoglobin: 13.8 g/dL (ref 13.2–17.1)
Lymphs Abs: 1239 cells/uL (ref 850–3900)
MCH: 29.9 pg (ref 27.0–33.0)
MCHC: 34.6 g/dL (ref 32.0–36.0)
MCV: 86.6 fL (ref 80.0–100.0)
MPV: 10.7 fL (ref 7.5–12.5)
Monocytes Relative: 6.3 %
Neutro Abs: 4977 cells/uL (ref 1500–7800)
Neutrophils Relative %: 71.1 %
Platelets: 182 10*3/uL (ref 140–400)
RBC: 4.61 10*6/uL (ref 4.20–5.80)
RDW: 14.9 % (ref 11.0–15.0)
Total Lymphocyte: 17.7 %
WBC: 7 10*3/uL (ref 3.8–10.8)

## 2022-07-20 LAB — HEPATIC FUNCTION PANEL
AG Ratio: 1.8 (calc) (ref 1.0–2.5)
ALT: 22 U/L (ref 9–46)
AST: 16 U/L (ref 10–35)
Albumin: 4.4 g/dL (ref 3.6–5.1)
Alkaline phosphatase (APISO): 54 U/L (ref 35–144)
Bilirubin, Direct: 0.1 mg/dL (ref 0.0–0.2)
Globulin: 2.5 g/dL (calc) (ref 1.9–3.7)
Indirect Bilirubin: 0.7 mg/dL (calc) (ref 0.2–1.2)
Total Bilirubin: 0.8 mg/dL (ref 0.2–1.2)
Total Protein: 6.9 g/dL (ref 6.1–8.1)

## 2022-07-20 MED ORDER — TERBINAFINE HCL 250 MG PO TABS: 250.0000 mg | ORAL_TABLET | Freq: Every day | ORAL | 0 refills | Status: AC

## 2022-08-02 DIAGNOSIS — R3914 Feeling of incomplete bladder emptying: Secondary | ICD-10-CM | POA: Diagnosis not present

## 2022-08-02 DIAGNOSIS — N401 Enlarged prostate with lower urinary tract symptoms: Secondary | ICD-10-CM | POA: Diagnosis not present

## 2022-08-02 DIAGNOSIS — N3000 Acute cystitis without hematuria: Secondary | ICD-10-CM | POA: Diagnosis not present

## 2022-08-02 DIAGNOSIS — R300 Dysuria: Secondary | ICD-10-CM | POA: Diagnosis not present

## 2022-08-02 DIAGNOSIS — R310 Gross hematuria: Secondary | ICD-10-CM | POA: Diagnosis not present

## 2022-09-07 ENCOUNTER — Other Ambulatory Visit: Payer: Self-pay | Admitting: Podiatry

## 2022-09-30 ENCOUNTER — Other Ambulatory Visit: Payer: Self-pay | Admitting: Podiatry

## 2022-09-30 NOTE — Telephone Encounter (Signed)
Please advise, upcoming appointment 01/22

## 2022-10-01 ENCOUNTER — Encounter: Payer: Self-pay | Admitting: Podiatry

## 2022-10-04 DIAGNOSIS — R300 Dysuria: Secondary | ICD-10-CM | POA: Diagnosis not present

## 2022-10-04 DIAGNOSIS — C61 Malignant neoplasm of prostate: Secondary | ICD-10-CM | POA: Diagnosis not present

## 2022-10-18 ENCOUNTER — Ambulatory Visit: Payer: BC Managed Care – PPO | Admitting: Podiatry

## 2022-10-18 DIAGNOSIS — Z79899 Other long term (current) drug therapy: Secondary | ICD-10-CM | POA: Diagnosis not present

## 2022-10-18 DIAGNOSIS — E1149 Type 2 diabetes mellitus with other diabetic neurological complication: Secondary | ICD-10-CM | POA: Diagnosis not present

## 2022-10-18 DIAGNOSIS — B351 Tinea unguium: Secondary | ICD-10-CM | POA: Diagnosis not present

## 2022-10-18 MED ORDER — GABAPENTIN 300 MG PO CAPS: 300.0000 mg | ORAL_CAPSULE | Freq: Every day | ORAL | 0 refills | Status: DC

## 2022-10-18 NOTE — Patient Instructions (Signed)
Gabapentin Capsules or Tablets What is this medication? GABAPENTIN (GA ba pen tin) treats nerve pain. It may also be used to prevent and control seizures in people with epilepsy. It works by calming overactive nerves in your body. This medicine may be used for other purposes; ask your health care provider or pharmacist if you have questions. COMMON BRAND NAME(S): Active-PAC with Gabapentin, Gabarone, Gralise, Neurontin What should I tell my care team before I take this medication? They need to know if you have any of these conditions: Alcohol or substance use disorder Kidney disease Lung or breathing disease Suicidal thoughts, plans, or attempt; a previous suicide attempt by you or a family member An unusual or allergic reaction to gabapentin, other medications, foods, dyes, or preservatives Pregnant or trying to get pregnant Breast-feeding How should I use this medication? Take this medication by mouth with a glass of water. Follow the directions on the prescription label. You can take it with or without food. If it upsets your stomach, take it with food. Take your medication at regular intervals. Do not take it more often than directed. Do not stop taking except on your care team's advice. If you are directed to break the 600 or 800 mg tablets in half as part of your dose, the extra half tablet should be used for the next dose. If you have not used the extra half tablet within 28 days, it should be thrown away. A special MedGuide will be given to you by the pharmacist with each prescription and refill. Be sure to read this information carefully each time. Talk to your care team about the use of this medication in children. While this medication may be prescribed for children as young as 3 years for selected conditions, precautions do apply. Overdosage: If you think you have taken too much of this medicine contact a poison control center or emergency room at once. NOTE: This medicine is only for  you. Do not share this medicine with others. What if I miss a dose? If you miss a dose, take it as soon as you can. If it is almost time for your next dose, take only that dose. Do not take double or extra doses. What may interact with this medication? Alcohol Antihistamines for allergy, cough, and cold Certain medications for anxiety or sleep Certain medications for depression like amitriptyline, fluoxetine, sertraline Certain medications for seizures like phenobarbital, primidone Certain medications for stomach problems General anesthetics like halothane, isoflurane, methoxyflurane, propofol Local anesthetics like lidocaine, pramoxine, tetracaine Medications that relax muscles for surgery Opioid medications for pain Phenothiazines like chlorpromazine, mesoridazine, prochlorperazine, thioridazine This list may not describe all possible interactions. Give your health care provider a list of all the medicines, herbs, non-prescription drugs, or dietary supplements you use. Also tell them if you smoke, drink alcohol, or use illegal drugs. Some items may interact with your medicine. What should I watch for while using this medication? Visit your care team for regular checks on your progress. You may want to keep a record at home of how you feel your condition is responding to treatment. You may want to share this information with your care team at each visit. You should contact your care team if your seizures get worse or if you have any new types of seizures. Do not stop taking this medication or any of your seizure medications unless instructed by your care team. Stopping your medication suddenly can increase your seizures or their severity. This medication may cause serious skin   reactions. They can happen weeks to months after starting the medication. Contact your care team right away if you notice fevers or flu-like symptoms with a rash. The rash may be red or purple and then turn into blisters or  peeling of the skin. Or, you might notice a red rash with swelling of the face, lips or lymph nodes in your neck or under your arms. Wear a medical identification bracelet or chain if you are taking this medication for seizures. Carry a card that lists all your medications. This medication may affect your coordination, reaction time, or judgment. Do not drive or operate machinery until you know how this medication affects you. Sit up or stand slowly to reduce the risk of dizzy or fainting spells. Drinking alcohol with this medication can increase the risk of these side effects. Your mouth may get dry. Chewing sugarless gum or sucking hard candy, and drinking plenty of water may help. Watch for new or worsening thoughts of suicide or depression. This includes sudden changes in mood, behaviors, or thoughts. These changes can happen at any time but are more common in the beginning of treatment or after a change in dose. Call your care team right away if you experience these thoughts or worsening depression. If you become pregnant while using this medication, you may enroll in the [place] Pregnancy Registry by calling [id number]. This registry collects information about the safety of antiepileptic medication use during pregnancy. What side effects may I notice from receiving this medication? Side effects that you should report to your care team as soon as possible: Allergic reactions or angioedema--skin rash, itching, hives, swelling of the face, eyes, lips, tongue, arms, or legs, trouble swallowing or breathing Rash, fever, and swollen lymph nodes Thoughts of suicide or self harm, worsening mood, feelings of depression Trouble breathing Unusual changes in mood or behavior in children after use such as difficulty concentrating, hostility, or restlessness Side effects that usually do not require medical attention (report to your care team if they continue or are  bothersome): Dizziness Drowsiness Nausea Swelling of ankles, feet, or hands Vomiting This list may not describe all possible side effects. Call your doctor for medical advice about side effects. You may report side effects to FDA at 1-800-FDA-1088. Where should I keep my medication? Keep out of reach of children and pets. Store at room temperature between 15 and 30 degrees C (59 and 86 degrees F). Get rid of any unused medication after the expiration date. This medication may cause accidental overdose and death if taken by other adults, children, or pets. To get rid of medications that are no longer needed or have expired: Take the medication to a medication take-back program. Check with your pharmacy or law enforcement to find a location. If you cannot return the medication, check the label or package insert to see if the medication should be thrown out in the garbage or flushed down the toilet. If you are not sure, ask your care team. If it is safe to put it in the trash, empty the medication out of the container. Mix the medication with cat litter, dirt, coffee grounds, or other unwanted substance. Seal the mixture in a bag or container. Put it in the trash. NOTE: This sheet is a summary. It may not cover all possible information. If you have questions about this medicine, talk to your doctor, pharmacist, or health care provider.  2023 Elsevier/Gold Standard (2021-03-16 00:00:00) Terbinafine Tablets What is this medication? TERBINAFINE (  TER bin a feen) treats fungal infections of the nails. It belongs to a group of medications called antifungals. It will not treat infections caused by bacteria or viruses. This medicine may be used for other purposes; ask your health care provider or pharmacist if you have questions. COMMON BRAND NAME(S): Lamisil, Terbinex What should I tell my care team before I take this medication? They need to know if you have any of these conditions: Liver disease An  unusual or allergic reaction to terbinafine, other medications, foods, dyes, or preservatives Pregnant or trying to get pregnant Breast-feeding How should I use this medication? Take this medication by mouth with water. Take it as directed on the prescription label at the same time every day. You can take it with or without food. If it upsets your stomach, take it with food. Keep taking it unless your care team tells you to stop. A special MedGuide will be given to you by the pharmacist with each prescription and refill. Be sure to read this information carefully each time. Talk to your care team regarding the use of this medication in children. Special care may be needed. Overdosage: If you think you have taken too much of this medicine contact a poison control center or emergency room at once. NOTE: This medicine is only for you. Do not share this medicine with others. What if I miss a dose? If you miss a dose, take it as soon as you can unless it is more than 4 hours late. If it is more than 4 hours late, skip the missed dose. Take the next dose at the normal time. What may interact with this medication? Do not take this medication with any of the following: Pimozide Thioridazine This medication may also interact with the following: Beta blockers Caffeine Certain medications for mental health conditions Cimetidine Cyclosporine Medications for fungal infections like fluconazole and ketoconazole Medications for irregular heartbeat like amiodarone, flecainide and propafenone Rifampin Warfarin This list may not describe all possible interactions. Give your health care provider a list of all the medicines, herbs, non-prescription drugs, or dietary supplements you use. Also tell them if you smoke, drink alcohol, or use illegal drugs. Some items may interact with your medicine. What should I watch for while using this medication? Visit your care team for regular checks on your progress. You  may need blood work while you are taking this medication. It may be some time before you see the benefit from this medication. This medication may cause serious skin reactions. They can happen weeks to months after starting the medication. Contact your care team right away if you notice fevers or flu-like symptoms with a rash. The rash may be red or purple and then turn into blisters or peeling of the skin. Or, you might notice a red rash with swelling of the face, lips or lymph nodes in your neck or under your arms. This medication can make you more sensitive to the sun. Keep out of the sun, If you cannot avoid being in the sun, wear protective clothing and sunscreen. Do not use sun lamps or tanning beds/booths. What side effects may I notice from receiving this medication? Side effects that you should report to your care team as soon as possible: Allergic reactions--skin rash, itching, hives, swelling of the face, lips, tongue, or throat Change in sense of smell Change in taste Infection--fever, chills, cough, or sore throat Liver injury--right upper belly pain, loss of appetite, nausea, light-colored stool, dark yellow or brown  urine, yellowing skin or eyes, unusual weakness or fatigue Low red blood cell level--unusual weakness or fatigue, dizziness, headache, trouble breathing Lupus-like syndrome--joint pain, swelling, or stiffness, butterfly-shaped rash on the face, rashes that get worse in the sun, fever, unusual weakness or fatigue Rash, fever, and swollen lymph nodes Redness, blistering, peeling, or loosening of the skin, including inside the mouth Unusual bruising or bleeding Worsening mood, feelings of depression Side effects that usually do not require medical attention (report to your care team if they continue or are bothersome): Diarrhea Gas Headache Nausea Stomach pain Upset stomach This list may not describe all possible side effects. Call your doctor for medical advice about  side effects. You may report side effects to FDA at 1-800-FDA-1088. Where should I keep my medication? Keep out of the reach of children and pets. Store between 20 and 25 degrees C (68 and 77 degrees F). Protect from light. Get rid of any unused medication after the expiration date. To get rid of medications that are no longer needed or have expired: Take the medication to a medication take-back program. Check with your pharmacy or law enforcement to find a location. If you cannot return the medication, check the label or package insert to see if the medication should be thrown out in the garbage or flushed down the toilet. If you are not sure, ask your care team. If it is safe to put it in the trash, take the medication out of the container. Mix the medication with cat litter, dirt, coffee grounds, or other unwanted substance. Seal the mixture in a bag or container. Put it in the trash. NOTE: This sheet is a summary. It may not cover all possible information. If you have questions about this medicine, talk to your doctor, pharmacist, or health care provider.  2023 Elsevier/Gold Standard (2021-04-07 00:00:00)

## 2022-10-18 NOTE — Progress Notes (Unsigned)
Subjective: Chief Complaint  Patient presents with   Foot Problem    Nail fungus, bilateral feet, 2 years    77 year old male presents the office with above concerns.  He states that he still Lamisil without any side effects.  He has been taking the gabapentin for neuropathy but still experiencing urinary symptoms.  No open lesions or reports he has no other concerns today.  Objective: AAO x3, NAD DP/PT pulses palpable bilaterally, CRT less than 3 seconds Sensation intact with Semmes Weinstein monofilament but still describing burning, tingling sensations to his feet. Nails continue be hypertrophic, dystrophic with yellow, brown discoloration.  There is clearing along the proximal nail folds.  There is no edema, erythema or signs of infection.  No open lesions. No pain with calf compression, swelling, warmth, erythema  Assessment: 77 year old male onychomycosis, neuropathy  Plan: -All treatment options discussed with the patient including all alternatives, risks, complications.  -As a courtesy debrided the nails x 10 without any complications or bleeding.  Will continue Lamisil for 30 more days.  Recheck CBC and LFT. -Increase gabapentin to 3 times a day.  Discussed side effects. -Patient encouraged to call the office with any questions, concerns, change in symptoms.

## 2022-10-19 ENCOUNTER — Other Ambulatory Visit: Payer: Self-pay | Admitting: Podiatry

## 2022-10-19 LAB — CBC WITH DIFFERENTIAL/PLATELET
Absolute Monocytes: 418 cells/uL (ref 200–950)
Basophils Absolute: 50 cells/uL (ref 0–200)
Basophils Relative: 0.7 %
Eosinophils Absolute: 490 cells/uL (ref 15–500)
Eosinophils Relative: 6.8 %
HCT: 38.2 % — ABNORMAL LOW (ref 38.5–50.0)
Hemoglobin: 13.1 g/dL — ABNORMAL LOW (ref 13.2–17.1)
Lymphs Abs: 770 cells/uL — ABNORMAL LOW (ref 850–3900)
MCH: 29.5 pg (ref 27.0–33.0)
MCHC: 34.3 g/dL (ref 32.0–36.0)
MCV: 86 fL (ref 80.0–100.0)
MPV: 10.4 fL (ref 7.5–12.5)
Monocytes Relative: 5.8 %
Neutro Abs: 5472 cells/uL (ref 1500–7800)
Neutrophils Relative %: 76 %
Platelets: 181 10*3/uL (ref 140–400)
RBC: 4.44 10*6/uL (ref 4.20–5.80)
RDW: 15.2 % — ABNORMAL HIGH (ref 11.0–15.0)
Total Lymphocyte: 10.7 %
WBC: 7.2 10*3/uL (ref 3.8–10.8)

## 2022-10-19 LAB — HEPATIC FUNCTION PANEL
AG Ratio: 1.8 (calc) (ref 1.0–2.5)
ALT: 17 U/L (ref 9–46)
AST: 15 U/L (ref 10–35)
Albumin: 4.2 g/dL (ref 3.6–5.1)
Alkaline phosphatase (APISO): 46 U/L (ref 35–144)
Bilirubin, Direct: 0.1 mg/dL (ref 0.0–0.2)
Globulin: 2.4 g/dL (calc) (ref 1.9–3.7)
Indirect Bilirubin: 0.4 mg/dL (calc) (ref 0.2–1.2)
Total Bilirubin: 0.5 mg/dL (ref 0.2–1.2)
Total Protein: 6.6 g/dL (ref 6.1–8.1)

## 2022-10-19 MED ORDER — TERBINAFINE HCL 250 MG PO TABS: 250.0000 mg | ORAL_TABLET | Freq: Every day | ORAL | 0 refills | Status: DC

## 2022-10-31 ENCOUNTER — Other Ambulatory Visit: Payer: Self-pay | Admitting: Podiatry

## 2022-11-29 DIAGNOSIS — R0981 Nasal congestion: Secondary | ICD-10-CM | POA: Diagnosis not present

## 2022-12-02 ENCOUNTER — Other Ambulatory Visit: Payer: Self-pay | Admitting: Podiatry

## 2022-12-24 ENCOUNTER — Other Ambulatory Visit: Payer: Self-pay | Admitting: Podiatry

## 2023-01-10 DIAGNOSIS — K2970 Gastritis, unspecified, without bleeding: Secondary | ICD-10-CM | POA: Diagnosis not present

## 2023-01-10 DIAGNOSIS — K2270 Barrett's esophagus without dysplasia: Secondary | ICD-10-CM | POA: Diagnosis not present

## 2023-01-10 DIAGNOSIS — K219 Gastro-esophageal reflux disease without esophagitis: Secondary | ICD-10-CM | POA: Diagnosis not present

## 2023-01-18 DIAGNOSIS — N401 Enlarged prostate with lower urinary tract symptoms: Secondary | ICD-10-CM | POA: Diagnosis not present

## 2023-01-18 DIAGNOSIS — N3281 Overactive bladder: Secondary | ICD-10-CM | POA: Diagnosis not present

## 2023-01-18 DIAGNOSIS — R3914 Feeling of incomplete bladder emptying: Secondary | ICD-10-CM | POA: Diagnosis not present

## 2023-01-18 DIAGNOSIS — R310 Gross hematuria: Secondary | ICD-10-CM | POA: Diagnosis not present

## 2023-01-21 DIAGNOSIS — R32 Unspecified urinary incontinence: Secondary | ICD-10-CM | POA: Diagnosis not present

## 2023-01-24 ENCOUNTER — Ambulatory Visit: Payer: BC Managed Care – PPO | Admitting: Podiatry

## 2023-01-24 DIAGNOSIS — B351 Tinea unguium: Secondary | ICD-10-CM

## 2023-01-24 DIAGNOSIS — E1149 Type 2 diabetes mellitus with other diabetic neurological complication: Secondary | ICD-10-CM | POA: Diagnosis not present

## 2023-01-24 NOTE — Patient Instructions (Signed)
You can use "urea nail gel" 

## 2023-01-24 NOTE — Progress Notes (Unsigned)
Subjective: Chief Complaint  Patient presents with   Nail Problem    Thick painful toenails, 3 month follow up    77 year old male presents the office with above concerns.  He states that he still Lamisil without any side effects.  No swelling redness or drainage the toenail sites. Objective: AAO x3, NAD DP/PT pulses palpable bilaterally, CRT less than 3 seconds Sensation intact with Semmes Weinstein monofilament but still describing burning, tingling sensations to his feet. Nails continue be hypertrophic, dystrophic with yellow, brown discoloration.  There is clearing along the proximal nail folds with some mild improvement.  There is no edema, erythema or signs of infection.  No open lesions. No pain with calf compression, swelling, warmth, erythema  Assessment: 77 year old male onychomycosis, neuropathy  Plan: -All treatment options discussed with the patient including all alternatives, risks, complications.  -As a courtesy debrided the nails x 10 without any complications or bleeding. -Discussed compound cream through [facility] if no improvement in the nails. -Continue gabapentin for neuropathy -Daily foot inspection, glucose control. -Patient encouraged to call the office with any questions, concerns, change in symptoms.   Vivi Barrack DPM

## 2023-02-07 DIAGNOSIS — N3941 Urge incontinence: Secondary | ICD-10-CM | POA: Diagnosis not present

## 2023-02-07 DIAGNOSIS — Z8546 Personal history of malignant neoplasm of prostate: Secondary | ICD-10-CM | POA: Diagnosis not present

## 2023-02-07 DIAGNOSIS — N401 Enlarged prostate with lower urinary tract symptoms: Secondary | ICD-10-CM | POA: Diagnosis not present

## 2023-02-21 ENCOUNTER — Other Ambulatory Visit: Payer: Self-pay | Admitting: Podiatry

## 2023-02-23 ENCOUNTER — Encounter: Payer: Self-pay | Admitting: Podiatry

## 2023-02-25 DIAGNOSIS — R32 Unspecified urinary incontinence: Secondary | ICD-10-CM | POA: Diagnosis not present

## 2023-03-01 ENCOUNTER — Other Ambulatory Visit: Payer: Self-pay | Admitting: Podiatry

## 2023-03-01 ENCOUNTER — Telehealth: Payer: Self-pay | Admitting: Podiatry

## 2023-03-01 NOTE — Telephone Encounter (Signed)
Spoke with patient via telephone today regarding terbinafine. Refill not appropriate at this time.

## 2023-03-01 NOTE — Telephone Encounter (Signed)
Patient called, noting his pharmacy doesn't have any refills of the terbinafine for him.  Reviewed patient's chart before returning his call.  He did a 90-day full course of terbinafine in October 2023, then a 30-day pulse dose of the terbinafine in March 2024.    He is still noticing improvement of the toenail(s).  Educated the patient today on protocol for oral terbinafine.  Typically a 90 day course, then stop, then occasionally need a "pulse dose" to "wake it up" and continue working.    He shouldn't need refills at this time, as he was informed it can remain in the soft tissues for a year after taking the medicine, and keep working on the new nail growth.  He has an appointment scheduled in a couple months with Dr. Ardelle Anton.  He is pleased with how the nails are looking so far and they continue to improve.  Informed patient I would notify Dr. Ardelle Anton and if he had any other concerns, he may reach out to him directly.

## 2023-03-28 ENCOUNTER — Telehealth: Payer: Self-pay | Admitting: Cardiovascular Disease

## 2023-03-28 DIAGNOSIS — Z79899 Other long term (current) drug therapy: Secondary | ICD-10-CM

## 2023-03-28 DIAGNOSIS — E782 Mixed hyperlipidemia: Secondary | ICD-10-CM

## 2023-03-28 DIAGNOSIS — I2510 Atherosclerotic heart disease of native coronary artery without angina pectoris: Secondary | ICD-10-CM

## 2023-03-28 NOTE — Telephone Encounter (Signed)
Patient wants to know if he will need lab work prior to his visit on 10/7.

## 2023-03-28 NOTE — Telephone Encounter (Signed)
Will forward to Dr Elease Hashimoto for review and recommendations ip pt needs labs prior to October visit ./cy

## 2023-04-12 NOTE — Telephone Encounter (Signed)
Fernando Blankenship, Fernando Ping, MD  [facility] Triage3 days ago   Lets get lipids, ALT, BMP several days prior to his office visit  PN   Lab orders placed and scheduled for 06/28/23. Pt made aware via MyChart.

## 2023-04-25 ENCOUNTER — Ambulatory Visit (INDEPENDENT_AMBULATORY_CARE_PROVIDER_SITE_OTHER): Payer: BC Managed Care – PPO | Admitting: Podiatry

## 2023-04-25 DIAGNOSIS — B351 Tinea unguium: Secondary | ICD-10-CM

## 2023-04-25 DIAGNOSIS — M79675 Pain in left toe(s): Secondary | ICD-10-CM

## 2023-04-25 DIAGNOSIS — E1149 Type 2 diabetes mellitus with other diabetic neurological complication: Secondary | ICD-10-CM

## 2023-04-25 DIAGNOSIS — M79674 Pain in right toe(s): Secondary | ICD-10-CM | POA: Diagnosis not present

## 2023-04-25 NOTE — Patient Instructions (Signed)
You can go up to taking gabapentin 300mg  twice a day if needed  You can use topical medications such as Voltaren gel or capsaicin cream.

## 2023-04-25 NOTE — Progress Notes (Unsigned)
Subjective: Chief Complaint  Patient presents with   Nail Problem    Nail fungus follow up    77 year old male presents the office with above concerns.  Overall doing better to the nails.  No swelling redness or drainage.  67 burning, tingling, neuropathy symptoms and discomfort to his feet.  Do cause discomfort to get elongated.  Objective: AAO x3, NAD DP/PT pulses palpable bilaterally, CRT less than 3 seconds Sensation intact with Semmes Weinstein monofilament but still describing burning, tingling sensations to his feet. Nails continue be hypertrophic, dystrophic with yellow, brown discoloration.  There is clearing along the proximal nail folds with some mild improvement.  There is still discoloration and dystrophy of the nail noted mostly distally.  There is no edema, erythema or signs of infection.  No open lesions. No pain with calf compression, swelling, warmth, erythema  Assessment: 77 year old male onychomycosis, neuropathy  Plan: -All treatment options discussed with the patient including all alternatives, risks, complications.  -Sharply debrided the nails x 10 without any complications or bleeding.  Hopefully the nails will continue to grow out. -Continue gabapentin for neuropathy-we discussed increasing to twice a day if needed.  Also discussed topical medication. -Daily foot inspection, glucose control. -Patient encouraged to call the office with any questions, concerns, change in symptoms.   Vivi Barrack DPM

## 2023-05-12 DIAGNOSIS — R32 Unspecified urinary incontinence: Secondary | ICD-10-CM | POA: Diagnosis not present

## 2023-06-06 ENCOUNTER — Other Ambulatory Visit: Payer: Self-pay | Admitting: Podiatry

## 2023-06-06 DIAGNOSIS — N390 Urinary tract infection, site not specified: Secondary | ICD-10-CM | POA: Diagnosis not present

## 2023-06-06 DIAGNOSIS — R310 Gross hematuria: Secondary | ICD-10-CM | POA: Diagnosis not present

## 2023-06-06 DIAGNOSIS — R3915 Urgency of urination: Secondary | ICD-10-CM | POA: Diagnosis not present

## 2023-06-06 DIAGNOSIS — N401 Enlarged prostate with lower urinary tract symptoms: Secondary | ICD-10-CM | POA: Diagnosis not present

## 2023-06-06 DIAGNOSIS — B961 Klebsiella pneumoniae [K. pneumoniae] as the cause of diseases classified elsewhere: Secondary | ICD-10-CM | POA: Diagnosis not present

## 2023-06-06 DIAGNOSIS — C61 Malignant neoplasm of prostate: Secondary | ICD-10-CM | POA: Diagnosis not present

## 2023-06-06 DIAGNOSIS — N3000 Acute cystitis without hematuria: Secondary | ICD-10-CM | POA: Diagnosis not present

## 2023-06-07 DIAGNOSIS — R32 Unspecified urinary incontinence: Secondary | ICD-10-CM | POA: Diagnosis not present

## 2023-06-09 ENCOUNTER — Telehealth: Payer: Self-pay | Admitting: Podiatry

## 2023-06-09 NOTE — Telephone Encounter (Signed)
I called patient to verify the dose of gabapentin as I got 2 different refills requested and we had discussed increased to the gabapentin 300mg  twice a day. Left voicemail and asked the patient to call back.

## 2023-06-14 NOTE — Telephone Encounter (Signed)
I called the patient back to verify refill. He states that he does not need any refills right now and this was an automatic refill but he has plenty. He does not need anything from me right now.

## 2023-06-27 DIAGNOSIS — Z23 Encounter for immunization: Secondary | ICD-10-CM | POA: Diagnosis not present

## 2023-06-27 DIAGNOSIS — I10 Essential (primary) hypertension: Secondary | ICD-10-CM | POA: Diagnosis not present

## 2023-06-27 DIAGNOSIS — E781 Pure hyperglyceridemia: Secondary | ICD-10-CM | POA: Diagnosis not present

## 2023-06-27 DIAGNOSIS — E1169 Type 2 diabetes mellitus with other specified complication: Secondary | ICD-10-CM | POA: Diagnosis not present

## 2023-06-27 DIAGNOSIS — I2510 Atherosclerotic heart disease of native coronary artery without angina pectoris: Secondary | ICD-10-CM | POA: Diagnosis not present

## 2023-06-28 ENCOUNTER — Ambulatory Visit: Payer: BC Managed Care – PPO | Attending: Cardiovascular Disease

## 2023-06-28 DIAGNOSIS — I2510 Atherosclerotic heart disease of native coronary artery without angina pectoris: Secondary | ICD-10-CM | POA: Diagnosis not present

## 2023-06-28 DIAGNOSIS — E782 Mixed hyperlipidemia: Secondary | ICD-10-CM

## 2023-06-28 DIAGNOSIS — Z79899 Other long term (current) drug therapy: Secondary | ICD-10-CM

## 2023-06-29 LAB — LIPID PANEL
Chol/HDL Ratio: 4.2 {ratio} (ref 0.0–5.0)
Cholesterol, Total: 121 mg/dL (ref 100–199)
HDL: 29 mg/dL — ABNORMAL LOW (ref >39)
LDL Chol Calc (NIH): 45 mg/dL (ref 0–99)
Triglycerides: 304 mg/dL — ABNORMAL HIGH (ref 0–149)
VLDL Cholesterol Cal: 47 mg/dL — ABNORMAL HIGH (ref 5–40)

## 2023-06-29 LAB — BASIC METABOLIC PANEL
BUN/Creatinine Ratio: 20 (ref 10–24)
BUN: 23 mg/dL (ref 8–27)
CO2: 23 mmol/L (ref 20–29)
Calcium: 9.3 mg/dL (ref 8.6–10.2)
Chloride: 98 mmol/L (ref 96–106)
Creatinine, Ser: 1.13 mg/dL (ref 0.76–1.27)
Glucose: 182 mg/dL — ABNORMAL HIGH (ref 70–99)
Potassium: 3.7 mmol/L (ref 3.5–5.2)
Sodium: 137 mmol/L (ref 134–144)
eGFR: 67 mL/min/{1.73_m2} (ref >59)

## 2023-06-29 LAB — ALT: ALT: 24 [IU]/L (ref 0–44)

## 2023-06-30 ENCOUNTER — Telehealth: Payer: Self-pay

## 2023-06-30 DIAGNOSIS — Z79899 Other long term (current) drug therapy: Secondary | ICD-10-CM

## 2023-06-30 DIAGNOSIS — E782 Mixed hyperlipidemia: Secondary | ICD-10-CM

## 2023-06-30 MED ORDER — CHOLINE FENOFIBRATE 135 MG PO CPDR: 135.0000 mg | DELAYED_RELEASE_CAPSULE | Freq: Every day | ORAL | 3 refills | Status: DC

## 2023-06-30 NOTE — Telephone Encounter (Signed)
-----   Message from Kristeen Miss sent at 06/30/2023  9:36 AM EDT ----- LDL looks good Trigs are elevated  Add Fenofibrate 135 mg a day  Continue low carb diet, low fat diet Work on increasing exercise and weight loss   Repeat lipids in 6 months

## 2023-06-30 NOTE — Telephone Encounter (Signed)
Called and spoke with patient who agrees to plan. Medication sent to [facility] as requested, labs ordered and scheduled for 12/29/23.

## 2023-07-03 ENCOUNTER — Encounter: Payer: Self-pay | Admitting: Cardiovascular Disease
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
## 2023-07-04 ENCOUNTER — Encounter: Payer: Self-pay | Admitting: Cardiovascular Disease

## 2023-07-04 ENCOUNTER — Ambulatory Visit: Payer: BC Managed Care – PPO | Attending: Cardiovascular Disease | Admitting: Cardiovascular Disease

## 2023-07-04 VITALS — BP 122/80 | HR 97 | Ht 67.0 in | Wt 251.6 lb

## 2023-07-04 DIAGNOSIS — Z09 Encounter for follow-up examination after completed treatment for conditions other than malignant neoplasm: Secondary | ICD-10-CM | POA: Diagnosis not present

## 2023-07-04 MED ORDER — NITROGLYCERIN 0.4 MG SL SUBL: 0.4000 mg | SUBLINGUAL_TABLET | SUBLINGUAL | 5 refills | Status: AC | PRN

## 2023-07-04 NOTE — Patient Instructions (Signed)
Medication Instructions:  Your physician recommends that you continue on your current medications as directed. Please refer to the Current Medication list given to you today.  *If you need a refill on your cardiac medications before your next appointment, please call your pharmacy*   Lab Work: Your physician recommends that you return for lab work on December 29, 2023.    If you have labs (blood work) drawn today and your tests are completely normal, you will receive your results only by: MyChart Message (if you have MyChart) OR A paper copy in the mail If you have any lab test that is abnormal or we need to change your treatment, we will call you to review the results.   Testing/Procedures:  none   Follow-Up: At [facility], you and your health needs are our priority.  As part of our continuing mission to provide you with exceptional heart care, we have created designated Provider Care Teams.  These Care Teams include your primary Cardiologist (physician) and Advanced Practice Providers (APPs -  Physician Assistants and Nurse Practitioners) who all work together to provide you with the care you need, when you need it.  We recommend signing up for the patient portal called "MyChart".  Sign up information is provided on this After Visit Summary.  MyChart is used to connect with patients for Virtual Visits (Telemedicine).  Patients are able to view lab/test results, encounter notes, upcoming appointments, etc.  Non-urgent messages can be sent to your provider as well.   To learn more about what you can do with MyChart, go to [email or web address].    Your next appointment:   12 month(s)  Provider:   Dr Flora Lipps  Other Instructions

## 2023-07-28 ENCOUNTER — Ambulatory Visit: Payer: BC Managed Care – PPO | Admitting: Podiatry

## 2023-08-08 ENCOUNTER — Ambulatory Visit: Payer: BC Managed Care – PPO | Admitting: Podiatry

## 2023-08-15 ENCOUNTER — Ambulatory Visit: Payer: BC Managed Care – PPO | Admitting: Podiatry

## 2023-08-15 DIAGNOSIS — M79674 Pain in right toe(s): Secondary | ICD-10-CM

## 2023-08-15 DIAGNOSIS — M79675 Pain in left toe(s): Secondary | ICD-10-CM | POA: Diagnosis not present

## 2023-08-15 DIAGNOSIS — E1149 Type 2 diabetes mellitus with other diabetic neurological complication: Secondary | ICD-10-CM

## 2023-08-15 DIAGNOSIS — B351 Tinea unguium: Secondary | ICD-10-CM | POA: Diagnosis not present

## 2023-08-15 MED ORDER — GABAPENTIN 300 MG PO CAPS: 300.0000 mg | ORAL_CAPSULE | Freq: Two times a day (BID) | ORAL | 0 refills | Status: DC

## 2023-08-15 NOTE — Patient Instructions (Signed)
Gabapentin Capsules or Tablets What is this medication? GABAPENTIN (GA ba pen tin) treats nerve pain. It may also be used to prevent and control seizures in people with epilepsy. It works by calming overactive nerves in your body. This medicine may be used for other purposes; ask your health care provider or pharmacist if you have questions. COMMON BRAND NAME(S): Active-PAC with Gabapentin, Ascencion Dike, Gralise, Neurontin What should I tell my care team before I take this medication? They need to know if you have any of these conditions: Kidney disease Lung or breathing disease Substance use disorder Suicidal thoughts, plans, or attempt by you or a family member An unusual or allergic reaction to gabapentin, other medications, foods, dyes, or preservatives Pregnant or trying to get pregnant Breastfeeding How should I use this medication? Take this medication by mouth with a glass of water. Follow the directions on the prescription label. You can take it with or without food. If it upsets your stomach, take it with food. Take your medication at regular intervals. Do not take it more often than directed. Do not stop taking except on your care team's advice. If you are directed to break the 600 or 800 mg tablets in half as part of your dose, the extra half tablet should be used for the next dose. If you have not used the extra half tablet within 28 days, it should be thrown away. A special MedGuide will be given to you by the pharmacist with each prescription and refill. Be sure to read this information carefully each time. Talk to your care team about the use of this medication in children. While this medication may be prescribed for children as young as 3 years for selected conditions, precautions do apply. Overdosage: If you think you have taken too much of this medicine contact a poison control center or emergency room at once. NOTE: This medicine is only for you. Do not share this medicine with  others. What if I miss a dose? If you miss a dose, take it as soon as you can. If it is almost time for your next dose, take only that dose. Do not take double or extra doses. What may interact with this medication? Alcohol Antihistamines for allergy, cough, and cold Certain medications for anxiety or sleep Certain medications for depression like amitriptyline, fluoxetine, sertraline Certain medications for seizures like phenobarbital, primidone Certain medications for stomach problems General anesthetics like halothane, isoflurane, methoxyflurane, propofol Local anesthetics like lidocaine, pramoxine, tetracaine Medications that relax muscles for surgery Opioid medications for pain Phenothiazines like chlorpromazine, mesoridazine, prochlorperazine, thioridazine This list may not describe all possible interactions. Give your health care provider a list of all the medicines, herbs, non-prescription drugs, or dietary supplements you use. Also tell them if you smoke, drink alcohol, or use illegal drugs. Some items may interact with your medicine. What should I watch for while using this medication? Visit your care team for regular checks on your progress. You may want to keep a record at home of how you feel your condition is responding to treatment. You may want to share this information with your care team at each visit. You should contact your care team if your seizures get worse or if you have any new types of seizures. Do not stop taking this medication or any of your seizure medications unless instructed by your care team. Stopping your medication suddenly can increase your seizures or their severity. This medication may cause serious skin reactions. They can happen weeks to  months after starting the medication. Contact your care team right away if you notice fevers or flu-like symptoms with a rash. The rash may be red or purple and then turn into blisters or peeling of the skin. Or, you might  notice a red rash with swelling of the face, lips or lymph nodes in your neck or under your arms. Wear a medical identification bracelet or chain if you are taking this medication for seizures. Carry a card that lists all your medications. This medication may affect your coordination, reaction time, or judgment. Do not drive or operate machinery until you know how this medication affects you. Sit up or stand slowly to reduce the risk of dizzy or fainting spells. Drinking alcohol with this medication can increase the risk of these side effects. Your mouth may get dry. Chewing sugarless gum or sucking hard candy, and drinking plenty of water may help. Watch for new or worsening thoughts of suicide or depression. This includes sudden changes in mood, behaviors, or thoughts. These changes can happen at any time but are more common in the beginning of treatment or after a change in dose. Call your care team right away if you experience these thoughts or worsening depression. If you become pregnant while using this medication, you may enroll in the [place] American Antiepileptic Drug Pregnancy Registry by calling [phone]. This registry collects information about the safety of antiepileptic medication use during pregnancy. What side effects may I notice from receiving this medication? Side effects that you should report to your care team as soon as possible: Allergic reactions or angioedema--skin rash, itching, hives, swelling of the face, eyes, lips, tongue, arms, or legs, trouble swallowing or breathing Rash, fever, and swollen lymph nodes Thoughts of suicide or self harm, worsening mood, feelings of depression Trouble breathing Unusual changes in mood or behavior in children after use such as difficulty concentrating, hostility, or restlessness Side effects that usually do not require medical attention (report to your care team if they continue or are  bothersome): Dizziness Drowsiness Nausea Swelling of ankles, feet, or hands Vomiting This list may not describe all possible side effects. Call your doctor for medical advice about side effects. You may report side effects to FDA at 1-800-FDA-1088. Where should I keep my medication? Keep out of reach of children and pets. Store at room temperature between 15 and 30 degrees C (59 and 86 degrees F). Get rid of any unused medication after the expiration date. This medication may cause accidental overdose and death if taken by other adults, children, or pets. To get rid of medications that are no longer needed or have expired: Take the medication to a medication take-back program. Check with your pharmacy or law enforcement to find a location. If you cannot return the medication, check the label or package insert to see if the medication should be thrown out in the garbage or flushed down the toilet. If you are not sure, ask your care team. If it is safe to put it in the trash, empty the medication out of the container. Mix the medication with cat litter, dirt, coffee grounds, or other unwanted substance. Seal the mixture in a bag or container. Put it in the trash. NOTE: This sheet is a summary. It may not cover all possible information. If you have questions about this medicine, talk to your doctor, pharmacist, or health care provider.  2024 Elsevier/Gold Standard (2022-06-29 00:00:00)

## 2023-08-15 NOTE — Progress Notes (Unsigned)
Subjective: No chief complaint on file.   77 year old male presents the office for thick, elongated nails that he cannot trim himself. No swelling redness or drainage.  He still gets burning, tingling, neuropathy symptoms and discomfort to his feet. No open lesions.   Objective: AAO x3, NAD DP/PT pulses palpable bilaterally, CRT less than 3 seconds Sensation intact with Semmes Weinstein monofilament but still describing burning, tingling sensations to his feet. Nails are hypertrophic, dystrophic with yellow, brown discoloration.  There is clearing along the proximal nail folds with some mild improvement.  There is  some clearing of the nails.  There is no edema, erythema or signs of infection.  No open lesions. No pain with calf compression, swelling, warmth, erythema  Assessment: 77 year old male onychomycosis, neuropathy  Plan: -All treatment options discussed with the patient including all alternatives, risks, complications.  -Sharply debrided the nails x 10 without any complications or bleeding.  Hopefully the nails will continue to grow out. -Continue gabapentin for neuropathy- refilled today.  -Daily foot inspection, glucose control. -Patient encouraged to call the office with any questions, concerns, change in symptoms.   No follow-ups on file.  Vivi Barrack DPM

## 2023-08-29 ENCOUNTER — Other Ambulatory Visit: Payer: Self-pay | Admitting: Podiatry

## 2023-08-29 DIAGNOSIS — C61 Malignant neoplasm of prostate: Secondary | ICD-10-CM | POA: Diagnosis not present

## 2023-09-02 NOTE — Telephone Encounter (Signed)
Attempted to call patient to go over refills. No answer and I attempted to leave a voicemail but it kept saying "are you still there".

## 2023-09-05 DIAGNOSIS — N401 Enlarged prostate with lower urinary tract symptoms: Secondary | ICD-10-CM | POA: Diagnosis not present

## 2023-09-05 DIAGNOSIS — R300 Dysuria: Secondary | ICD-10-CM | POA: Diagnosis not present

## 2023-09-05 DIAGNOSIS — N390 Urinary tract infection, site not specified: Secondary | ICD-10-CM | POA: Diagnosis not present

## 2023-09-05 DIAGNOSIS — B961 Klebsiella pneumoniae [K. pneumoniae] as the cause of diseases classified elsewhere: Secondary | ICD-10-CM | POA: Diagnosis not present

## 2023-09-05 DIAGNOSIS — Z8546 Personal history of malignant neoplasm of prostate: Secondary | ICD-10-CM | POA: Diagnosis not present

## 2023-09-05 DIAGNOSIS — R310 Gross hematuria: Secondary | ICD-10-CM | POA: Diagnosis not present

## 2023-09-27 IMAGING — NM NM PULMONARY PERF PARTICULATE
8 series · 8 of 8 positions shown · non-contrast
Comparison: CT chest 09/06/2021

CLINICAL DATA: 75-year-old male with chest pain and chronic cough.
Tachycardic.

EXAM:
NUCLEAR MEDICINE PERFUSION LUNG SCAN
TECHNIQUE: Perfusion images were obtained in multiple projections after
intravenous injection of radiopharmaceutical.
RADIOPHARMACEUTICALS:  4.1 mCi Zc-66m MAA

[Series 1: ant/post perf · 4.14mm/px · 1 of 1 slices shown (1 of 2)]
[im 1/1]
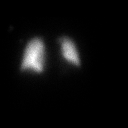

[Series 1: ant/post perf · 4.14mm/px · 1 of 1 slices shown (2 of 2)]
[im 1/1]
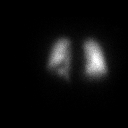

[Series 2: lao/rpo perf · 4.14mm/px · 1 of 1 slices shown (1 of 2)]
[im 1/1]
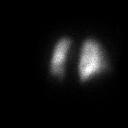

[Series 2: lao/rpo perf · 4.14mm/px · 1 of 1 slices shown (2 of 2)]
[im 1/1]
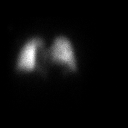

[Series 3: lpo/rao perf · 4.14mm/px · 1 of 1 slices shown (1 of 2)]
[im 1/1]
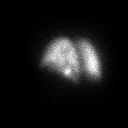

[Series 3: lpo/rao perf · 4.14mm/px · 1 of 1 slices shown (2 of 2)]
[im 1/1]
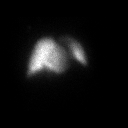

[Series 4: lt lat/rt lat perf · 4.14mm/px · 1 of 1 slices shown (1 of 2)]
[im 1/1]
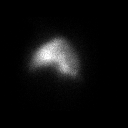

[Series 4: lt lat/rt lat perf · 4.14mm/px · 1 of 1 slices shown (2 of 2)]
[im 1/1]
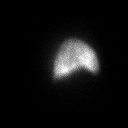

[8 of 8 positions shown; findings below may reference images not displayed]

FINDINGS: Single linear defect in the posterior LEFT lower lobe seen on
posterior projection. This perfusion defect is favored to correspond
atelectasis on comparison CT. No wedge-shaped peripheral perfusion
defects to suggest acute pulmonary.
IMPRESSION: 1. No evidence acute pulmonary embolism.
2. LEFT basilar atelectasis.

## 2023-12-22 ENCOUNTER — Ambulatory Visit (INDEPENDENT_AMBULATORY_CARE_PROVIDER_SITE_OTHER): Admitting: Podiatry

## 2023-12-22 ENCOUNTER — Encounter: Payer: Self-pay | Admitting: Podiatry

## 2023-12-22 DIAGNOSIS — M79674 Pain in right toe(s): Secondary | ICD-10-CM | POA: Diagnosis not present

## 2023-12-22 DIAGNOSIS — M79675 Pain in left toe(s): Secondary | ICD-10-CM

## 2023-12-22 DIAGNOSIS — B351 Tinea unguium: Secondary | ICD-10-CM

## 2023-12-22 DIAGNOSIS — E1149 Type 2 diabetes mellitus with other diabetic neurological complication: Secondary | ICD-10-CM | POA: Diagnosis not present

## 2023-12-22 MED ORDER — GABAPENTIN 300 MG PO CAPS: 300.0000 mg | ORAL_CAPSULE | Freq: Two times a day (BID) | ORAL | 0 refills | Status: DC

## 2023-12-22 NOTE — Progress Notes (Signed)
 Subjective: Chief Complaint  Patient presents with   [facility]     RM#53 DFC     78 year old male presents the office for thick, elongated nails that he cannot trim himself. No swelling redness or drainage.  No open lesions. No new concerns.    Objective: AAO x3, NAD DP/PT pulses palpable bilaterally, CRT less than 3 seconds Sensation intact with Semmes Weinstein monofilament but still describing burning, tingling sensations to his feet. Nails are hypertrophic, dystrophic with yellow, brown discoloration.  There is clearing along the proximal nail folds with some mild improvement.  There is  some clearing of the nails.  There is no edema, erythema or signs of infection.  No open lesions. No pain with calf compression, swelling, warmth, erythema  Assessment: 78 year old male onychomycosis, neuropathy  Plan: -All treatment options discussed with the patient including all alternatives, risks, complications.  -Sharply debrided the nails x 10 without any complications or bleeding.  Hopefully the nails will continue to grow out. -Continue gabapentin for neuropathy-order refill today. -Daily foot inspection, glucose control. -Patient encouraged to call the office with any questions, concerns, change in symptoms.   No follow-ups on file.  Vivi Barrack DPM

## 2023-12-22 NOTE — Patient Instructions (Signed)
 Gabapentin Capsules or Tablets What is this medication? GABAPENTIN (GA ba pen tin) treats nerve pain. It may also be used to prevent and control seizures in people with epilepsy. It works by calming overactive nerves in your body. This medicine may be used for other purposes; ask your health care provider or pharmacist if you have questions. COMMON BRAND NAME(S): Active-PAC with Gabapentin, Ascencion Dike, Gralise, Neurontin What should I tell my care team before I take this medication? They need to know if you have any of these conditions: Kidney disease Lung or breathing disease Substance use disorder Suicidal thoughts, plans, or attempt by you or a family member An unusual or allergic reaction to gabapentin, other medications, foods, dyes, or preservatives Pregnant or trying to get pregnant Breastfeeding How should I use this medication? Take this medication by mouth with a glass of water. Follow the directions on the prescription label. You can take it with or without food. If it upsets your stomach, take it with food. Take your medication at regular intervals. Do not take it more often than directed. Do not stop taking except on your care team's advice. If you are directed to break the 600 or 800 mg tablets in half as part of your dose, the extra half tablet should be used for the next dose. If you have not used the extra half tablet within 28 days, it should be thrown away. A special MedGuide will be given to you by the pharmacist with each prescription and refill. Be sure to read this information carefully each time. Talk to your care team about the use of this medication in children. While this medication may be prescribed for children as young as 3 years for selected conditions, precautions do apply. Overdosage: If you think you have taken too much of this medicine contact a poison control center or emergency room at once. NOTE: This medicine is only for you. Do not share this medicine with  others. What if I miss a dose? If you miss a dose, take it as soon as you can. If it is almost time for your next dose, take only that dose. Do not take double or extra doses. What may interact with this medication? Alcohol Antihistamines for allergy, cough, and cold Certain medications for anxiety or sleep Certain medications for depression like amitriptyline, fluoxetine, sertraline Certain medications for seizures like phenobarbital, primidone Certain medications for stomach problems General anesthetics like halothane, isoflurane, methoxyflurane, propofol Local anesthetics like lidocaine, pramoxine, tetracaine Medications that relax muscles for surgery Opioid medications for pain Phenothiazines like chlorpromazine, mesoridazine, prochlorperazine, thioridazine This list may not describe all possible interactions. Give your health care provider a list of all the medicines, herbs, non-prescription drugs, or dietary supplements you use. Also tell them if you smoke, drink alcohol, or use illegal drugs. Some items may interact with your medicine. What should I watch for while using this medication? Visit your care team for regular checks on your progress. You may want to keep a record at home of how you feel your condition is responding to treatment. You may want to share this information with your care team at each visit. You should contact your care team if your seizures get worse or if you have any new types of seizures. Do not stop taking this medication or any of your seizure medications unless instructed by your care team. Stopping your medication suddenly can increase your seizures or their severity. This medication may cause serious skin reactions. They can happen weeks to  months after starting the medication. Contact your care team right away if you notice fevers or flu-like symptoms with a rash. The rash may be red or purple and then turn into blisters or peeling of the skin. Or, you might  notice a red rash with swelling of the face, lips or lymph nodes in your neck or under your arms. Wear a medical identification bracelet or chain if you are taking this medication for seizures. Carry a card that lists all your medications. This medication may affect your coordination, reaction time, or judgment. Do not drive or operate machinery until you know how this medication affects you. Sit up or stand slowly to reduce the risk of dizzy or fainting spells. Drinking alcohol with this medication can increase the risk of these side effects. Your mouth may get dry. Chewing sugarless gum or sucking hard candy, and drinking plenty of water may help. Watch for new or worsening thoughts of suicide or depression. This includes sudden changes in mood, behaviors, or thoughts. These changes can happen at any time but are more common in the beginning of treatment or after a change in dose. Call your care team right away if you experience these thoughts or worsening depression. If you become pregnant while using this medication, you may enroll in the [place] American Antiepileptic Drug Pregnancy Registry by calling [phone]. This registry collects information about the safety of antiepileptic medication use during pregnancy. What side effects may I notice from receiving this medication? Side effects that you should report to your care team as soon as possible: Allergic reactions or angioedema--skin rash, itching, hives, swelling of the face, eyes, lips, tongue, arms, or legs, trouble swallowing or breathing Rash, fever, and swollen lymph nodes Thoughts of suicide or self harm, worsening mood, feelings of depression Trouble breathing Unusual changes in mood or behavior in children after use such as difficulty concentrating, hostility, or restlessness Side effects that usually do not require medical attention (report to your care team if they continue or are  bothersome): Dizziness Drowsiness Nausea Swelling of ankles, feet, or hands Vomiting This list may not describe all possible side effects. Call your doctor for medical advice about side effects. You may report side effects to FDA at 1-800-FDA-1088. Where should I keep my medication? Keep out of reach of children and pets. Store at room temperature between 15 and 30 degrees C (59 and 86 degrees F). Get rid of any unused medication after the expiration date. This medication may cause accidental overdose and death if taken by other adults, children, or pets. To get rid of medications that are no longer needed or have expired: Take the medication to a medication take-back program. Check with your pharmacy or law enforcement to find a location. If you cannot return the medication, check the label or package insert to see if the medication should be thrown out in the garbage or flushed down the toilet. If you are not sure, ask your care team. If it is safe to put it in the trash, empty the medication out of the container. Mix the medication with cat litter, dirt, coffee grounds, or other unwanted substance. Seal the mixture in a bag or container. Put it in the trash. NOTE: This sheet is a summary. It may not cover all possible information. If you have questions about this medicine, talk to your doctor, pharmacist, or health care provider.  2024 Elsevier/Gold Standard (2022-06-29 00:00:00)

## 2023-12-29 ENCOUNTER — Other Ambulatory Visit: Payer: BC Managed Care – PPO

## 2024-01-06 ENCOUNTER — Telehealth: Payer: Self-pay | Admitting: Podiatry

## 2024-01-06 ENCOUNTER — Other Ambulatory Visit: Payer: Self-pay | Admitting: Podiatry

## 2024-01-06 DIAGNOSIS — R609 Edema, unspecified: Secondary | ICD-10-CM

## 2024-01-06 NOTE — Telephone Encounter (Signed)
 Daughter is with Pt and she is calling because he said/told her there was suppose to be an order for braces. She was calling to see where the order went to. While looking thru the last visit notes (3/27) the Pt then said they may have been for socks. I didn't see wither, just saw order for medication.  She is now needing to verify if there was anything ordered besides the gabapentin for the neuropathy/diabetes.  Please call Daughter back with an follow-up/update. TY .

## 2024-01-09 NOTE — Telephone Encounter (Signed)
 FYI.Fernando Blankenship...was calling daughter to relay your message and that we was waiting to hear back from [facility]......... how about he called them back already.... he did put in an order (some where) for some socks. Daughter well pleased and grateful for the Dr calling and said he was really nice........ Im' closing this call

## 2024-01-17 ENCOUNTER — Other Ambulatory Visit: Payer: Self-pay | Admitting: Podiatry

## 2024-01-17 ENCOUNTER — Telehealth: Payer: Self-pay

## 2024-01-17 DIAGNOSIS — R609 Edema, unspecified: Secondary | ICD-10-CM

## 2024-01-17 NOTE — Telephone Encounter (Signed)
 Called daughter, states they never received prescription. He has an appt on Thursday, will give him a new Rx then. Fernando Blankenship states that will be fine. No other questions or concerns.

## 2024-01-17 NOTE — Telephone Encounter (Signed)
 [name] (daughter) called stating a RX was to be sent over to [facility] but they have not received anything. [name] said [name] said Fernando Blankenship would benefit from a Velcro wrap and would been the compression level. The Rx can be faxed to [phone].

## 2024-01-19 ENCOUNTER — Encounter: Payer: Self-pay | Admitting: Podiatry

## 2024-01-19 ENCOUNTER — Ambulatory Visit (INDEPENDENT_AMBULATORY_CARE_PROVIDER_SITE_OTHER): Admitting: Podiatry

## 2024-01-19 DIAGNOSIS — B351 Tinea unguium: Secondary | ICD-10-CM | POA: Diagnosis not present

## 2024-01-19 DIAGNOSIS — M79674 Pain in right toe(s): Secondary | ICD-10-CM | POA: Diagnosis not present

## 2024-01-19 DIAGNOSIS — E1149 Type 2 diabetes mellitus with other diabetic neurological complication: Secondary | ICD-10-CM | POA: Diagnosis not present

## 2024-01-19 DIAGNOSIS — M79675 Pain in left toe(s): Secondary | ICD-10-CM

## 2024-01-24 NOTE — Progress Notes (Signed)
 Subjective: Chief Complaint  Patient presents with   Nail Problem    RM#13 Follow up on dermatophytosis of nail.    78 year old male presents the office for thick, elongated nails that he cannot trim himself. No swelling redness or drainage.  No open lesions.  Asking for a new prescription for his compression socks.  Objective: AAO x3, NAD DP/PT pulses palpable bilaterally, CRT less than 3 seconds Chronic bilateral lower extremity edema present.  No erythema or warmth or any open lesions. Sensation intact with Semmes Weinstein monofilament but still describing burning, tingling sensations to his feet.  This is unchanged. Nails are hypertrophic, dystrophic with yellow, brown discoloration.  Tenderness nails 1-5 bilaterally.   No pain with calf compression, swelling, warmth, erythema  Assessment: 78 year old male onychomycosis, neuropathy  Plan: -All treatment options discussed with the patient including all alternatives, risks, complications.  -Sharply debrided the nails x 10 without any complications or bleeding.  -Continue gabapentin  for neuropathy -I have previously faxed was given a prescription for compression socks. -Daily foot inspection, glucose control. -Patient encouraged to call the office with any questions, concerns, change in symptoms.   Return in about 9 weeks (around 03/22/2024).  Charity Conch DPM

## 2024-03-22 ENCOUNTER — Ambulatory Visit: Admitting: Podiatry

## 2024-07-06 ENCOUNTER — Ambulatory Visit: Admitting: Podiatry

## 2024-07-06 ENCOUNTER — Ambulatory Visit

## 2024-07-06 ENCOUNTER — Encounter: Payer: Self-pay | Admitting: Podiatry

## 2024-07-06 DIAGNOSIS — M778 Other enthesopathies, not elsewhere classified: Secondary | ICD-10-CM

## 2024-07-06 DIAGNOSIS — E1149 Type 2 diabetes mellitus with other diabetic neurological complication: Secondary | ICD-10-CM

## 2024-07-06 DIAGNOSIS — M79675 Pain in left toe(s): Secondary | ICD-10-CM

## 2024-07-06 DIAGNOSIS — M79674 Pain in right toe(s): Secondary | ICD-10-CM | POA: Diagnosis not present

## 2024-07-06 DIAGNOSIS — B351 Tinea unguium: Secondary | ICD-10-CM | POA: Diagnosis not present

## 2024-07-06 DIAGNOSIS — M898X9 Other specified disorders of bone, unspecified site: Secondary | ICD-10-CM

## 2024-07-08 NOTE — Progress Notes (Signed)
 Subjective: Chief Complaint  Patient presents with   Diabetes   Foot Pain    DFC Toenail trim. Left foot dorsal pain. 0 pain at the present. Wearing compression socks. 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  78 year old male presents the office for thick, elongated nails that he cannot trim himself. No swelling redness or drainage.  No open lesions.    He also has a spot on the top of his left foot which causes discomfort.  This been ongoing for quite some time he tries to manage this with offloading and padding however he states continues to have discomfort he would like to have this removed if possible.  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  Objective: AAO x3, NAD DP/PT pulses palpable bilaterally, CRT less than 3 seconds Chronic bilateral lower extremity edema present.  No erythema or warmth or any open lesions. Sensation intact with Semmes Weinstein monofilament but still describing burning, tingling sensations to his feet.  This is unchanged. Nails are hypertrophic, dystrophic with yellow, brown discoloration.  Tenderness nails 1-5 bilaterally.   On the dorsal aspect left midfoot is a prominent bone spur present with mild erythema from rubbing.  There is no open lesions or warmth or any signs of infection.  Tenderness palpation along the bone spur. No pain with calf compression, swelling, warmth, erythema  Assessment: 78 year old male onychomycosis, neuropathy; exostosis  Plan: Symptomatic onychomycosis -Sharply debrided the nails x 10 without any complications or bleeding.  -Continue gabapentin  for neuropathy  Exostosis -X-rays obtained reviewed.  Dorsal spurring is present at the midfoot.  There is no evidence of acute fracture. -We discussed both conservative as well as surgical treatment options.  He would have to proceed with exostectomy after the first of the year.  Discussed the procedure as well as postoperative course.  Alternatives, risks, complications were  discussed.  Will see him back in 3 months for his routine care and we will further evaluate and discuss surgery more at that time..  Return in about 3 months (around 10/06/2024) for nail trim; surgery consult left bone spur.  Donnice JONELLE Fees DPM

## 2024-09-06 ENCOUNTER — Other Ambulatory Visit: Payer: Self-pay | Admitting: Podiatry

## 2024-09-07 ENCOUNTER — Other Ambulatory Visit: Payer: Self-pay | Admitting: Physician Assistant

## 2024-09-07 DIAGNOSIS — E782 Mixed hyperlipidemia: Secondary | ICD-10-CM

## 2024-09-07 DIAGNOSIS — Z79899 Other long term (current) drug therapy: Secondary | ICD-10-CM

## 2024-09-10 MED ORDER — CHOLINE FENOFIBRATE 135 MG PO CPDR: 135.0000 mg | DELAYED_RELEASE_CAPSULE | Freq: Every day | ORAL | 3 refills | Status: AC

## 2024-09-10 NOTE — Telephone Encounter (Signed)
 Former Pt of Dr. Calhoun. Does any Provider want to refill this RX? Please advise.

## 2024-10-05 ENCOUNTER — Ambulatory Visit: Admitting: Podiatry

## 2024-10-29 ENCOUNTER — Other Ambulatory Visit: Payer: Self-pay | Admitting: Podiatry

## 2024-11-08 ENCOUNTER — Ambulatory Visit: Admitting: Podiatry
